# Patient Record
Sex: Male | Born: 2000 | Race: White | Hispanic: No | Marital: Single | Smoking: Former smoker
Health system: Southern US, Community
[De-identification: ages and names within clinical notes are randomized; demographics above are authoritative.]

## PROBLEM LIST (undated history)

## (undated) DIAGNOSIS — F319 Bipolar disorder, unspecified: Secondary | ICD-10-CM

## (undated) DIAGNOSIS — J36 Peritonsillar abscess: Secondary | ICD-10-CM

## (undated) HISTORY — PX: NO PAST SURGERIES: SHX2092

---
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
## 2020-05-08 ENCOUNTER — Ambulatory Visit
Admission: RE | Admit: 2020-05-08 | Discharge: 2020-05-08 | Disposition: A | Discharge status: Home / Self Care | Payer: BC Managed Care – PPO | Source: Ambulatory Visit | Attending: Family Medicine | Admitting: Family Medicine

## 2020-05-08 ENCOUNTER — Other Ambulatory Visit: Payer: Self-pay

## 2020-05-08 VITALS — BP 111/78 | HR 96 | Temp 98.2°F | Resp 18 | Ht 65.0 in | Wt 180.0 lb

## 2020-05-08 DIAGNOSIS — F319 Bipolar disorder, unspecified: Secondary | ICD-10-CM

## 2020-05-08 HISTORY — DX: Bipolar disorder, unspecified: F31.9

## 2020-05-08 MED ORDER — PALIPERIDONE ER 6 MG PO TB24: 6.0000 mg | ORAL_TABLET | Freq: Two times a day (BID) | ORAL | 0 refills | Status: DC

## 2020-05-08 NOTE — Discharge Instructions (Signed)
Be sure to get follow up with a psychiatrist.  Take care  Dr. Adriana Simas

## 2020-05-08 NOTE — ED Triage Notes (Signed)
Patient in today requesting refill on generic Invega 6 mg.Patient ran out today. Patient had both doses yesterday.

## 2020-05-08 NOTE — ED Provider Notes (Signed)
[facility]    CSN: [id number] Arrival date & time: 05/08/20  1448      History   Chief Complaint Chief Complaint  Patient presents with   Appointment   Medication Refill   HPI  19 year old male presents for medication refill.  Patient is bipolar disorder.  Was hospitalized earlier in the year.  He is currently on [place].  He has not seen a psychiatrist in a few months.  He has run out of his medication and is in need of refill.  He states that he is doing well at this time.  He has no concerns.  Past Medical History:  Diagnosis Date   Bipolar disorder [facility])    Past Surgical History:  Procedure Laterality Date   NO PAST SURGERIES     Home Medications    Prior to Admission medications   Medication Sig Start Date End Date Taking? Authorizing Provider  gabapentin (NEURONTIN) 300 MG capsule Take 300 mg by mouth at bedtime. 04/05/20  Yes [provider]  paliperidone (INVEGA) 6 MG 24 hr tablet Take 1 tablet (6 mg total) by mouth 2 (two) times daily. 05/08/20   Tommie Sams, DO    Family History Family History  Problem Relation Age of Onset   Healthy Mother    Other Father        MVA    Social History Social History   Tobacco Use   Smoking status: Current Every Day Smoker   Smokeless tobacco: Never Used   Tobacco comment: 3 cig/day  Vaping Use   Vaping Use: Never used  Substance Use Topics   Alcohol use: No   Drug use: No     Allergies   Penicillin g   Review of Systems Review of Systems  Psychiatric/Behavioral: Negative.    Physical Exam Triage Vital Signs ED Triage Vitals [05/08/20 0903]  Enc Vitals Group     BP 111/78     Pulse Rate 96     Resp 18     Temp 98.2 F (36.8 C)     Temp Source Oral     SpO2 100 %     Weight 180 lb (81.6 kg)     Height 5\' 5"  (1.651 m)     Head Circumference      Peak Flow      Pain Score 0     Pain Loc      Pain Edu?      Excl. in GC?    Updated Vital Signs BP 111/78  (BP Location: Left Arm)    Pulse 96    Temp 98.2 F (36.8 C) (Oral)    Resp 18    Ht 5\' 5"  (1.651 m)    Wt 81.6 kg    SpO2 100%    BMI 29.95 kg/m   Visual Acuity Right Eye Distance:   Left Eye Distance:   Bilateral Distance:    Right Eye Near:   Left Eye Near:    Bilateral Near:     Physical Exam Vitals and nursing note reviewed.  Constitutional:      General: He is not in acute distress.    Appearance: Normal appearance. He is not ill-appearing.  HENT:     Head: Normocephalic and atraumatic.  Cardiovascular:     Rate and Rhythm: Normal rate and regular rhythm.     Heart sounds: No murmur heard.   Pulmonary:     Effort: Pulmonary effort is normal.  Breath sounds: Normal breath sounds. No wheezing, rhonchi or rales.  Neurological:     Mental Status: He is alert.  Psychiatric:     Comments: Flat affect.  Depressed mood.    UC Treatments / Results  Labs (all labs ordered are listed, but only abnormal results are displayed) Labs Reviewed - No data to display  EKG   Radiology No results found.  Procedures Procedures (including critical care time)  Medications Ordered in UC Medications - No data to display  Initial Impression / Assessment and Plan / UC Course  I have reviewed the triage vital signs and the nursing notes.  Pertinent labs & imaging results that were available during my care of the patient were reviewed by me and considered in my medical decision making (see chart for details).    19 year old male presents for medication refill.  Bipolar disorder appears to be stable at this time.  Refill paliperidone.  Advised that he needs to follow-up with psychiatry.  Final Clinical Impressions(s) / UC Diagnoses   Final diagnoses:  Bipolar affective disorder, remission status unspecified [facility])     Discharge Instructions     Be sure to get follow up with a psychiatrist.  Take care  Dr. Adriana Simas    ED Prescriptions    Medication Sig Dispense Auth.  Provider   paliperidone (INVEGA) 6 MG 24 hr tablet Take 1 tablet (6 mg total) by mouth 2 (two) times daily. 60 tablet Everlene Other G, DO     PDMP not reviewed this encounter.   Everlene Other [place], [place] 05/08/20 [phone]

## 2020-06-04 ENCOUNTER — Other Ambulatory Visit: Payer: Self-pay | Admitting: Family Medicine

## 2020-09-17 ENCOUNTER — Telehealth: Payer: Self-pay

## 2020-09-17 NOTE — Telephone Encounter (Signed)
Lvm to r/s 5/17 appt 

## 2020-09-24 ENCOUNTER — Ambulatory Visit: Payer: BC Managed Care – PPO | Admitting: Nurse Practitioner

## 2021-04-25 ENCOUNTER — Other Ambulatory Visit: Payer: Self-pay

## 2021-04-25 ENCOUNTER — Encounter: Payer: Self-pay | Admitting: Emergency Medicine

## 2021-04-25 ENCOUNTER — Ambulatory Visit
Admission: EM | Admit: 2021-04-25 | Discharge: 2021-04-25 | Disposition: A | Discharge status: Home / Self Care | Payer: BC Managed Care – PPO | Attending: Emergency Medicine | Admitting: Emergency Medicine

## 2021-04-25 DIAGNOSIS — J069 Acute upper respiratory infection, unspecified: Secondary | ICD-10-CM | POA: Insufficient documentation

## 2021-04-25 DIAGNOSIS — F17210 Nicotine dependence, cigarettes, uncomplicated: Secondary | ICD-10-CM | POA: Diagnosis not present

## 2021-04-25 DIAGNOSIS — Z20822 Contact with and (suspected) exposure to covid-19: Secondary | ICD-10-CM | POA: Diagnosis not present

## 2021-04-25 DIAGNOSIS — R6883 Chills (without fever): Secondary | ICD-10-CM | POA: Insufficient documentation

## 2021-04-25 LAB — RESP PANEL BY RT-PCR (FLU A&B, COVID) ARPGX2
Influenza A by PCR: NEGATIVE
Influenza B by PCR: NEGATIVE
SARS Coronavirus 2 by RT PCR: NEGATIVE

## 2021-04-25 MED ORDER — IPRATROPIUM BROMIDE 0.06 % NA SOLN: 2.0000 | Freq: Four times a day (QID) | NASAL | 12 refills | Status: DC

## 2021-04-25 NOTE — ED Triage Notes (Signed)
Patient c/o runny nose for a week.  Patient states that he woke up this morning with chills and headache.  Patient unsure of fevers.

## 2021-04-25 NOTE — Discharge Instructions (Signed)
Use the Atrovent nasal spray, 2 squirts in each nostril every 6 hours, as needed for runny nose and postnasal drip.  Use OTC Tylenol and Ibuprofen as needed for fever, aches, or chills.  Return for reevaluation or see your primary care provider for any new or worsening symptoms.

## 2021-04-25 NOTE — ED Provider Notes (Signed)
[facility]    CSN: [id number] Arrival date & time: 04/25/21  0846      History   Chief Complaint Chief Complaint  Patient presents with   Headache   Nasal Congestion    HPI Jonathan Cooke is a 20 y.o. male.   HPI  20 year old male here for evaluation of respiratory complaints.  Patient reports that he has been experiencing a runny nose for the past week that has a clear nasal discharge and then developed headache and chills this morning.  He has not had a fever and denies ear pain, sore throat, cough, GI complaints, or known sick contacts.  Past Medical History:  Diagnosis Date   Bipolar disorder (HCC)     There are no problems to display for this patient.   Past Surgical History:  Procedure Laterality Date   NO PAST SURGERIES         Home Medications    Prior to Admission medications   Medication Sig Start Date End Date Taking? Authorizing Provider  ipratropium (ATROVENT) 0.06 % nasal spray Place 2 sprays into both nostrils 4 (four) times daily. 04/25/21  Yes Becky Augusta, NP  Melatonin 10 MG CAPS Take by mouth.   Yes [provider]    Family History Family History  Problem Relation Age of Onset   Healthy Mother    Other Father        MVA    Social History Social History   Tobacco Use   Smoking status: Every Day    Types: Cigarettes   Smokeless tobacco: Never   Tobacco comments:    3 cig/day  Vaping Use   Vaping Use: Never used  Substance Use Topics   Alcohol use: No   Drug use: No     Allergies   Penicillin g   Review of Systems Review of Systems  Constitutional:  Positive for chills. Negative for activity change, appetite change and fever.  HENT:  Positive for congestion and rhinorrhea. Negative for ear pain and sore throat.   Respiratory:  Negative for cough, shortness of breath and wheezing.   Gastrointestinal:  Negative for diarrhea, nausea and vomiting.  Musculoskeletal:  Negative for arthralgias and  myalgias.  Skin:  Negative for rash.  Neurological:  Positive for headaches.  Hematological: Negative.   Psychiatric/Behavioral: Negative.      Physical Exam Triage Vital Signs ED Triage Vitals  Enc Vitals Group     BP 04/25/21 0953 121/68     Pulse Rate 04/25/21 0953 95     Resp 04/25/21 0953 15     Temp 04/25/21 0953 98.1 F (36.7 C)     Temp Source 04/25/21 0953 Oral     SpO2 04/25/21 0953 99 %     Weight 04/25/21 0949 188 lb (85.3 kg)     Height 04/25/21 0949 5' 5.5" (1.664 m)     Head Circumference --      Peak Flow --      Pain Score 04/25/21 0949 5     Pain Loc --      Pain Edu? --      Excl. in GC? --    No data found.  Updated Vital Signs BP 121/68 (BP Location: Left Arm)    Pulse 95    Temp 98.1 F (36.7 C) (Oral)    Resp 15    Ht 5' 5.5" (1.664 m)    Wt 188 lb (85.3 kg)    SpO2 99%  BMI 30.81 kg/m   Visual Acuity Right Eye Distance:   Left Eye Distance:   Bilateral Distance:    Right Eye Near:   Left Eye Near:    Bilateral Near:     Physical Exam Vitals and nursing note reviewed.  Constitutional:      General: He is not in acute distress.    Appearance: Normal appearance. He is not ill-appearing.  HENT:     Head: Normocephalic and atraumatic.     Right Ear: Ear canal and external ear normal. There is no impacted cerumen.     Left Ear: Ear canal and external ear normal. There is no impacted cerumen.     Nose: Congestion and rhinorrhea present.     Mouth/Throat:     Mouth: Mucous membranes are moist.     Pharynx: Oropharynx is clear. No posterior oropharyngeal erythema.  Cardiovascular:     Rate and Rhythm: Normal rate and regular rhythm.     Pulses: Normal pulses.     Heart sounds: Normal heart sounds. No murmur heard.   No gallop.  Pulmonary:     Effort: Pulmonary effort is normal.     Breath sounds: Normal breath sounds. No wheezing, rhonchi or rales.  Musculoskeletal:     Cervical back: Normal range of motion and neck supple.   Lymphadenopathy:     Cervical: No cervical adenopathy.  Skin:    General: Skin is warm and dry.     Capillary Refill: Capillary refill takes less than 2 seconds.     Findings: No erythema or rash.  Neurological:     General: No focal deficit present.     Mental Status: He is alert and oriented to person, place, and time.  Psychiatric:        Mood and Affect: Mood normal.        Behavior: Behavior normal.        Thought Content: Thought content normal.     UC Treatments / Results  Labs (all labs ordered are listed, but only abnormal results are displayed) Labs Reviewed  RESP PANEL BY RT-PCR (FLU A&B, COVID) [id number]    EKG   Radiology No results found.  Procedures Procedures (including critical care time)  Medications Ordered in UC Medications - No data to display  Initial Impression / Assessment and Plan / UC Course  I have reviewed the triage vital signs and the nursing notes.  Pertinent labs & imaging results that were available during my care of the patient were reviewed by me and considered in my medical decision making (see chart for details).  Patient is a nontoxic-appearing 20 year old male here for evaluation of respiratory complaints as outlined HPI above.  On physical exam patient has mildly erythematous tympanic membranes bilaterally but there is no injection, effusion, or bulging present.  Both external auditory canals are clear.  Nasal mucosa is erythematous and edematous with clear nasal discharge in both nares.  Oropharyngeal exam is benign.  Cardiopulmonary exam reveals clear lung sounds in all fields.  No cervical lymphadenopathy appreciated on exam.  Respiratory triplex panel was collected at triage and is negative for COVID and influenza.  Patient's exam is consistent with a viral URI.  We will give Atrovent nasal spray to help with the nasal congestion and I have given him a work note for today.   Final Clinical Impressions(s) / UC Diagnoses   Final  diagnoses:  Upper respiratory tract infection, unspecified type     Discharge Instructions  Use the Atrovent nasal spray, 2 squirts in each nostril every 6 hours, as needed for runny nose and postnasal drip.  Use OTC Tylenol and Ibuprofen as needed for fever, aches, or chills.  Return for reevaluation or see your primary care provider for any new or worsening symptoms.      ED Prescriptions     Medication Sig Dispense Auth. Provider   ipratropium (ATROVENT) 0.06 % nasal spray Place 2 sprays into both nostrils 4 (four) times daily. 15 mL Becky Augusta, NP      PDMP not reviewed this encounter.   Becky Augusta, NP 04/25/21 1106

## 2021-07-26 ENCOUNTER — Ambulatory Visit
Admission: EM | Admit: 2021-07-26 | Discharge: 2021-07-26 | Disposition: A | Discharge status: Home / Self Care | Payer: BC Managed Care – PPO | Attending: Internal Medicine | Admitting: Internal Medicine

## 2021-07-26 ENCOUNTER — Other Ambulatory Visit: Payer: Self-pay

## 2021-07-26 DIAGNOSIS — L0501 Pilonidal cyst with abscess: Secondary | ICD-10-CM | POA: Insufficient documentation

## 2021-07-26 MED ORDER — SULFAMETHOXAZOLE-TRIMETHOPRIM 800-160 MG PO TABS: 1.0000 | ORAL_TABLET | Freq: Two times a day (BID) | ORAL | 0 refills | Status: AC

## 2021-07-26 NOTE — ED Provider Notes (Signed)
?[facility] ? ? ? ?CSN: [id number] ?Arrival date & time: 07/26/21  [id number] ? ? ?  ? ?History   ?Chief Complaint ?Chief Complaint  ?Patient presents with  ? Abscess  ? ? ?HPI ?Bayardo Nassif is a 21 y.o. male who presents with swelling of his tail bone and drained on 3/11. Has been dealing with this issue x 3 years. Has never been examined for this before.  ? ? ? ?Past Medical History:  ?Diagnosis Date  ? Bipolar disorder ([place])   ? ? ?There are no problems to display for this patient. ? ? ?Past Surgical History:  ?Procedure Laterality Date  ? NO PAST SURGERIES    ? ? ?Home Medications   ? ?Prior to Admission medications   ?Medication Sig Start Date End Date Taking? Authorizing Provider  ?Melatonin 10 MG CAPS Take by mouth.   Yes [provider]  ?sulfamethoxazole-trimethoprim (BACTRIM DS) 800-160 MG tablet Take 1 tablet by mouth 2 (two) times daily for 7 days. 07/26/21 08/02/21 Yes Rodriguez-Southworth, Sunday Spillers, PA-C  ? ? ?Family History ?Family History  ?Problem Relation Age of Onset  ? Healthy Mother   ? Other Father   ?     MVA  ? ? ?Social History ?Social History  ? ?Tobacco Use  ? Smoking status: Every Day  ?  Types: Cigarettes  ? Smokeless tobacco: Never  ? Tobacco comments:  ?  3 cig/day  ?Vaping Use  ? Vaping Use: Every day  ?Substance Use Topics  ? Alcohol use: Yes  ? Drug use: No  ? ? ? ?Allergies   ?Penicillin g ? ? ?Review of Systems ?Review of Systems  ?Constitutional:  Negative for chills and fever.  ?Gastrointestinal:  Negative for rectal pain.  ?Skin:  Positive for wound.  ?Hematological:  Negative for adenopathy.  ? ? ?Physical Exam ?Triage Vital Signs ?ED Triage Vitals  ?Enc Vitals Group  ?   BP 07/26/21 0818 (!) 126/95  ?   Pulse Rate 07/26/21 0818 89  ?   Resp 07/26/21 0818 18  ?   Temp 07/26/21 0818 98.1 ?F (36.7 ?C)  ?   Temp Source 07/26/21 0818 Oral  ?   SpO2 07/26/21 0818 100 %  ?   Weight 07/26/21 0819 185 lb (83.9 kg)  ?   Height 07/26/21 0819 5\' 5"  (1.651 m)  ?   Head  Circumference --   ?   Peak Flow --   ?   Pain Score 07/26/21 0817 9  ?   Pain Loc --   ?   Pain Edu? --   ?   Excl. in [place]? --   ? ?No data found. ? ?Updated Vital Signs ?BP (!) 126/95 (BP Location: Left Arm)   Pulse 89   Temp 98.1 ?F (36.7 ?C) (Oral)   Resp 18   Ht 5\' 5"  (1.651 m)   Wt 185 lb (83.9 kg)   SpO2 100%   BMI 30.79 kg/m?  ? ?Visual Acuity ?Right Eye Distance:   ?Left Eye Distance:   ?Bilateral Distance:   ? ?Right Eye Near:   ?Left Eye Near:    ?Bilateral Near:    ? ?Physical Exam ?Vitals and nursing note reviewed.  ?Constitutional:   ?   General: He is not in acute distress. ?   Appearance: He is not toxic-appearing.  ?HENT:  ?   Right Ear: External ear normal.  ?   Left Ear: External ear normal.  ?Eyes:  ?  General: No scleral icterus. ?   Conjunctiva/sclera: Conjunctivae normal.  ?Pulmonary:  ?   Effort: Pulmonary effort is normal.  ?Musculoskeletal:  ?   Cervical back: Neck supple.  ?Skin: ?   General: Skin is warm and dry.  ?   Findings: No rash.  ?   Comments: GLUTEAL FOLD- upper portion shows fluctuant mass, induration and erythema surround it.   ?Neurological:  ?   Mental Status: He is alert and oriented to person, place, and time.  ?   Gait: Gait normal.  ?Psychiatric:     ?   Mood and Affect: Mood normal.     ?   Behavior: Behavior normal.     ?   Thought Content: Thought content normal.     ?   Judgment: Judgment normal.  ? ? ? ?UC Treatments / Results  ?Labs ?(all labs ordered are listed, but only abnormal results are displayed) ?Labs Reviewed  ?AEROBIC CULTURE W GRAM STAIN (SUPERFICIAL SPECIMEN)  ? ? ?EKG ? ? ?Radiology ?No results found. ? ?Procedures ?Incision and Drainage ? ?Date/Time: 07/26/2021 4:11 PM ?Performed by: Shelby Mattocks, PA-C ?Authorized by: Shelby Mattocks, PA-C  ? ?Consent:  ?  Consent obtained:  Verbal ?  Consent given by:  Patient ?  Risks discussed:  Bleeding, incomplete drainage, pain and damage to other organs ?  Alternatives  discussed:  No treatment ?Universal protocol:  ?  Procedure explained and questions answered to patient or proxy's satisfaction: yes   ?  Patient identity confirmed:  Verbally with patient ?Location:  ?  Type:  Pilonidal cyst ?  Size:  3 cm x 2 cm ?  Location: upper gluteal fold. ?Pre-procedure details:  ?  Skin preparation:  Betadine ?Sedation:  ?  Sedation type:  None ?Anesthesia:  ?  Anesthesia method:  Local infiltration ?  Local anesthetic:  Lidocaine 1% WITH epi ?Procedure type:  ?  Complexity:  Complex ?Procedure details:  ?  Incision types:  Single straight ?  Incision depth:  Subcutaneous ?  Wound management:  Probed and deloculated and irrigated with saline ?  Drainage:  Purulent ?  Drainage amount:  Moderate ?  Packing materials:  1/4 in gauze ?Post-procedure details:  ?  Procedure completion:  Tolerated well, no immediate complications ?Comments:  ?   Dressing applied  (including critical care time) ? ?Medications Ordered in UC ?Medications - No data to display ? ?Initial Impression / Assessment and Plan / UC Course  ?I have reviewed the triage vital signs and the nursing notes. ?Wound culture was sent out ?Has Pilonidal cyst abscess  ?I placed him on Bactrim DS as noted. Wound instructions reviewed and can come here if not comfortable pulling the pack out.  ?We will call pt if the culture shows we need to change the medication. ?Advised to Fu with general surgeon.  ?Final Clinical Impressions(s) / UC Diagnoses  ? ?Final diagnoses:  ?Pilonidal cyst with abscess  ? ? ? ?Discharge Instructions   ? ?  ?Change the outside dressing 1-2 times a day when soaked ?The packing can be pulled out in 3 days, you may do it or come back here  for [place] to help you with it. The main thin, is to remove it when there is hardly any puss on the dressing.  ?You may take showers, but not bath's.  ?I am sending a would culture and this will tell us what bacteria is present and if the antibiotic I am placing you on  is the correct  one, if not we will call you to change it.  ?Follow up with a general surgeon in the next few week.  ? ? ? ?ED Prescriptions   ? ? Medication Sig Dispense Auth. Provider  ? sulfamethoxazole-trimethoprim (BACTRIM DS) 800-160 MG tablet Take 1 tablet by mouth 2 (two) times daily for 7 days. 14 tablet Rodriguez-Southworth, Sunday Spillers, PA-C  ? ?  ? ?PDMP not reviewed this encounter. ?  ?Shelby Mattocks, PA-C ?07/26/21 1616 ? ?

## 2021-07-26 NOTE — ED Triage Notes (Addendum)
Pt c/o abscess on tailbone that has started to hurt. Pt states that it has been present for a long time but recently became painful and has gotten larger. ? ?Pt states that it has drained enough fluid to soak through his underwear and pants on 07/19/21 ? ?

## 2021-07-26 NOTE — Discharge Instructions (Signed)
Change the outside dressing 1-2 times a day when soaked ?The packing can be pulled out in 3 days, you may do it or come back here  for [place] to help you with it. The main thin, is to remove it when there is hardly any puss on the dressing.  ?You may take showers, but not bath's.  ?I am sending a would culture and this will tell us what bacteria is present and if the antibiotic I am placing you on is the correct one, if not we will call you to change it.  ?Follow up with a general surgeon in the next few week.  ?

## 2021-07-28 LAB — AEROBIC CULTURE W GRAM STAIN (SUPERFICIAL SPECIMEN): Special Requests: NORMAL

## 2021-08-11 ENCOUNTER — Ambulatory Visit (INDEPENDENT_AMBULATORY_CARE_PROVIDER_SITE_OTHER): Payer: BC Managed Care – PPO

## 2021-08-11 ENCOUNTER — Ambulatory Visit
Admission: RE | Admit: 2021-08-11 | Discharge: 2021-08-11 | Disposition: A | Discharge status: Home / Self Care | Payer: BC Managed Care – PPO | Source: Ambulatory Visit

## 2021-08-11 VITALS — BP 144/77 | HR 79 | Temp 97.8°F | Resp 16

## 2021-08-11 DIAGNOSIS — M25511 Pain in right shoulder: Secondary | ICD-10-CM | POA: Diagnosis not present

## 2021-08-11 DIAGNOSIS — S43401A Unspecified sprain of right shoulder joint, initial encounter: Secondary | ICD-10-CM | POA: Diagnosis not present

## 2021-08-11 NOTE — Discharge Instructions (Addendum)
-  Your xray looks normal. There is no fracture or AC separation.  ?-Rest, ice, tylenol/ibuprofen ?-Follow-up if symptoms persist  ? ?
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
## 2021-08-11 NOTE — ED Triage Notes (Signed)
Pt reports pain in right shoulder x 2 days after he hear a pop while at a grappling practice. Pain is worse when trying to reach across his body with right arm or try to raise right arm. Ibuprofen gives no relief.  ?

## 2021-08-13 ENCOUNTER — Encounter: Payer: Self-pay | Admitting: Emergency Medicine

## 2021-08-13 ENCOUNTER — Ambulatory Visit
Admission: EM | Admit: 2021-08-13 | Discharge: 2021-08-13 | Disposition: A | Discharge status: Home / Self Care | Payer: BC Managed Care – PPO | Attending: Emergency Medicine | Admitting: Emergency Medicine

## 2021-08-13 ENCOUNTER — Other Ambulatory Visit: Payer: Self-pay

## 2021-08-13 DIAGNOSIS — M25511 Pain in right shoulder: Secondary | ICD-10-CM | POA: Diagnosis not present

## 2021-08-13 MED ORDER — KETOROLAC TROMETHAMINE 60 MG/2ML IM SOLN
30.0000 mg | Freq: Once | INTRAMUSCULAR | Status: AC
  Administered 2021-08-13: 30 mg via INTRAMUSCULAR

## 2021-08-13 MED ORDER — CYCLOBENZAPRINE HCL 10 MG PO TABS
10.0000 mg | ORAL_TABLET | Freq: Every day | ORAL | 0 refills | Status: DC
Start: 2021-08-13 — End: 2021-11-03

## 2021-08-13 MED ORDER — PREDNISONE 20 MG PO TABS: 40.0000 mg | ORAL_TABLET | Freq: Every day | ORAL | 0 refills | Status: DC

## 2021-08-13 NOTE — ED Triage Notes (Signed)
Pt c/o right shoulder pain. He was seen on 08/11/21 for this and told it was a sprain. He states he reached over to get something and felt a "roll and a pop" in his shoulder and now the pain is much worse.  ?

## 2021-08-13 NOTE — Discharge Instructions (Signed)
Today why do not feel like there is injury to your bone, I do have concerns about injury to the rotator cuff therefore you will need to follow-up with an orthopedic specialist for further evaluation and management ? ?You have been given an injection of Toradol in office to help reduce inflammation and for pain ? ?Starting tomorrow take prednisone every morning with food for further reduction of inflammation, while using medication you may use Tylenol throughout the day ? ?You may use muscle relaxer at bedtime as needed for additional comfort ? ?May place heat or ice over the affected area in 10 to 15-minute intervals ? ?You may place pillow underneath your arms and behind your back for support ? ?

## 2021-08-13 NOTE — ED Provider Notes (Signed)
?[facility] ? ? ? ?CSN: [id number] ?Arrival date & time: 08/13/21  1929 ? ? ?  ? ?History   ?Chief Complaint ?Chief Complaint  ?Patient presents with  ? Shoulder Pain  ?  right  ? ? ?HPI ?Jonathan Cooke is a 21 y.o. male.  ? ?Patient presents with right-sided shoulder pain for 5 days, symptoms worsened 1 day ago, rolled over and heard a popping sensation.  Pain is located anteriorly and is described as pulsating.  Limited range of motion, unable to reach her arm across body.  Symptoms worsen when lowering the arm .  Denies numbness or tingling.  Has attempted use of ibuprofen which has been ineffective.  Shoulder pain was evaluated 2 days ago in urgent care setting, x-ray negative.  History of AC joint injury in the left arm.  ? ?Past Medical History:  ?Diagnosis Date  ? Bipolar disorder ([place])   ? ? ?There are no problems to display for this patient. ? ? ?Past Surgical History:  ?Procedure Laterality Date  ? NO PAST SURGERIES    ? ? ? ? ? ?Home Medications   ? ?Prior to Admission medications   ?Medication Sig Start Date End Date Taking? Authorizing Provider  ?ibuprofen (ADVIL) 200 MG tablet Take 200 mg by mouth every 6 (six) hours as needed.    [provider]  ?Melatonin 10 MG CAPS Take by mouth.    [provider]  ? ? ?Family History ?Family History  ?Problem Relation Age of Onset  ? Healthy Mother   ? Other Father   ?     MVA  ? ? ?Social History ?Social History  ? ?Tobacco Use  ? Smoking status: Every Day  ?  Types: Cigarettes  ? Smokeless tobacco: Never  ? Tobacco comments:  ?  3 cig/day  ?Vaping Use  ? Vaping Use: Every day  ?Substance Use Topics  ? Alcohol use: Yes  ? Drug use: No  ? ? ? ?Allergies   ?Penicillin g ? ? ?Review of Systems ?Review of Systems  ?Constitutional: Negative.   ?Respiratory: Negative.    ?Cardiovascular: Negative.   ?Neurological: Negative.   ? ? ?Physical Exam ?Triage Vital Signs ?ED Triage Vitals [08/13/21 1941]  ?Enc Vitals Group  ?   BP 122/84  ?   Pulse  Rate 89  ?   Resp 18  ?   Temp 98.4 ?F (36.9 ?C)  ?   Temp Source Oral  ?   SpO2 100 %  ?   Weight 184 lb 15.5 oz (83.9 kg)  ?   Height 5\' 5"  (1.651 m)  ?   Head Circumference   ?   Peak Flow   ?   Pain Score 9  ?   Pain Loc   ?   Pain Edu?   ?   Excl. in [place]?   ? ?No data found. ? ?Updated Vital Signs ?BP 122/84 (BP Location: Left Arm)   Pulse 89   Temp 98.4 ?F (36.9 ?C) (Oral)   Resp 18   Ht 5\' 5"  (1.651 m)   Wt 184 lb 15.5 oz (83.9 kg)   SpO2 100%   BMI 30.78 kg/m?  ? ?Visual Acuity ?Right Eye Distance:   ?Left Eye Distance:   ?Bilateral Distance:   ? ?Right Eye Near:   ?Left Eye Near:    ?Bilateral Near:    ? ?Physical Exam ?Constitutional:   ?   Appearance: Normal appearance.  ?HENT:  ?  Head: Normocephalic.  ?Eyes:  ?   Extraocular Movements: Extraocular movements intact.  ?Pulmonary:  ?   Effort: Pulmonary effort is normal.  ?Musculoskeletal:  ?   Comments: Tenderness over the [facility] joint and anterior aspect of the right shoulder, limited range of motion, unable to assess Hawkins sign due to level of pain, strength is 4 out of 5, no elbow or wrist involvement  ?Skin: ?   General: Skin is warm and dry.  ?Neurological:  ?   Mental Status: He is alert and oriented to person, place, and time. Mental status is at baseline.  ?Psychiatric:     ?   Mood and Affect: Mood normal.     ?   Behavior: Behavior normal.  ? ? ? ?UC Treatments / Results  ?Labs ?(all labs ordered are listed, but only abnormal results are displayed) ?Labs Reviewed - No data to display ? ?EKG ? ? ?Radiology ?No results found. ? ?Procedures ?Procedures (including critical care time) ? ?Medications Ordered in UC ?Medications - No data to display ? ?Initial Impression / Assessment and Plan / UC Course  ?I have reviewed the triage vital signs and the nursing notes. ? ?Pertinent labs & imaging results that were available during my care of the patient were reviewed by me and considered in my medical decision making (see chart for details). ? ?Acute  pain of the right shoulder ? ?Will defer imaging as x-ray completed 2 days ago, suspicion for injury to the rotator cuff or AC joint, discussed with patient, will need follow-up with orthopedic specialist, given walking referral, Toradol injection given in office today, prednisone 40 mg burst and Flexeril prescribed for outpatient use, recommended RICE, heat, pillows for support and activity as tolerated, work note given ?Final Clinical Impressions(s) / UC Diagnoses  ? ?Final diagnoses:  ?None  ? ?Discharge Instructions   ?None ?  ? ?ED Prescriptions   ?None ?  ? ?PDMP not reviewed this encounter. ?  ?Hans Eden, NP ?08/13/21 2005 ? ?

## 2021-11-03 ENCOUNTER — Ambulatory Visit
Admission: EM | Admit: 2021-11-03 | Discharge: 2021-11-03 | Disposition: A | Discharge status: Home / Self Care | Payer: BC Managed Care – PPO | Attending: Internal Medicine | Admitting: Internal Medicine

## 2021-11-03 DIAGNOSIS — N179 Acute kidney failure, unspecified: Secondary | ICD-10-CM | POA: Diagnosis present

## 2021-11-03 LAB — CBC WITH DIFFERENTIAL/PLATELET
Abs Immature Granulocytes: 0.03 10*3/uL (ref 0.00–0.07)
Basophils Absolute: 0 10*3/uL (ref 0.0–0.1)
Basophils Relative: 0 %
Eosinophils Absolute: 0.1 10*3/uL (ref 0.0–0.5)
Eosinophils Relative: 1 %
HCT: 42.7 % (ref 39.0–52.0)
Hemoglobin: 14.5 g/dL (ref 13.0–17.0)
Immature Granulocytes: 0 %
Lymphocytes Relative: 34 %
Lymphs Abs: 2.5 10*3/uL (ref 0.7–4.0)
MCH: 31.5 pg (ref 26.0–34.0)
MCHC: 34 g/dL (ref 30.0–36.0)
MCV: 92.6 fL (ref 80.0–100.0)
Monocytes Absolute: 0.7 10*3/uL (ref 0.1–1.0)
Monocytes Relative: 10 %
Neutro Abs: 4 10*3/uL (ref 1.7–7.7)
Neutrophils Relative %: 55 %
Platelets: 277 10*3/uL (ref 150–400)
RBC: 4.61 MIL/uL (ref 4.22–5.81)
RDW: 13 % (ref 11.5–15.5)
WBC: 7.3 10*3/uL (ref 4.0–10.5)
nRBC: 0 % (ref 0.0–0.2)

## 2021-11-03 LAB — COMPREHENSIVE METABOLIC PANEL
ALT: 31 U/L (ref 0–44)
AST: 36 U/L (ref 15–41)
Albumin: 4.5 g/dL (ref 3.5–5.0)
Alkaline Phosphatase: 100 U/L (ref 38–126)
Anion gap: 6 (ref 5–15)
BUN: 17 mg/dL (ref 6–20)
CO2: 25 mmol/L (ref 22–32)
Calcium: 9.2 mg/dL (ref 8.9–10.3)
Chloride: 105 mmol/L (ref 98–111)
Creatinine, Ser: 1.3 mg/dL — ABNORMAL HIGH (ref 0.61–1.24)
GFR, Estimated: >60 mL/min (ref >60)
Glucose, Bld: 96 mg/dL (ref 70–99)
Potassium: 3.9 mmol/L (ref 3.5–5.1)
Sodium: 136 mmol/L (ref 135–145)
Total Bilirubin: 0.9 mg/dL (ref 0.3–1.2)
Total Protein: 7.1 g/dL (ref 6.5–8.1)

## 2021-11-03 LAB — CK: Total CK: 309 U/L (ref 49–397)

## 2021-11-03 MED ORDER — SODIUM CHLORIDE 0.9 % IV BOLUS
1000.0000 mL | Freq: Once | INTRAVENOUS | Status: AC
  Administered 2021-11-03: 1000 mL via INTRAVENOUS

## 2022-04-09 ENCOUNTER — Ambulatory Visit
Admission: EM | Admit: 2022-04-09 | Discharge: 2022-04-09 | Disposition: A | Discharge status: Home / Self Care | Payer: BC Managed Care – PPO | Attending: Physician Assistant | Admitting: Physician Assistant

## 2022-04-09 ENCOUNTER — Encounter: Payer: Self-pay | Admitting: Emergency Medicine

## 2022-04-09 DIAGNOSIS — R067 Sneezing: Secondary | ICD-10-CM | POA: Diagnosis not present

## 2022-04-09 DIAGNOSIS — F3170 Bipolar disorder, currently in remission, most recent episode unspecified: Secondary | ICD-10-CM | POA: Insufficient documentation

## 2022-04-09 DIAGNOSIS — Z1152 Encounter for screening for COVID-19: Secondary | ICD-10-CM | POA: Diagnosis not present

## 2022-04-09 DIAGNOSIS — Z79899 Other long term (current) drug therapy: Secondary | ICD-10-CM | POA: Diagnosis not present

## 2022-04-09 DIAGNOSIS — R0989 Other specified symptoms and signs involving the circulatory and respiratory systems: Secondary | ICD-10-CM | POA: Diagnosis not present

## 2022-04-09 DIAGNOSIS — R52 Pain, unspecified: Secondary | ICD-10-CM | POA: Insufficient documentation

## 2022-04-09 DIAGNOSIS — R509 Fever, unspecified: Secondary | ICD-10-CM | POA: Diagnosis not present

## 2022-04-09 HISTORY — DX: Peritonsillar abscess: J36

## 2022-04-09 LAB — RESP PANEL BY RT-PCR (FLU A&B, COVID) ARPGX2
Influenza A by PCR: NEGATIVE
Influenza B by PCR: NEGATIVE
SARS Coronavirus 2 by RT PCR: NEGATIVE

## 2022-04-09 NOTE — ED Provider Notes (Signed)
[facility]    CSN: [id number] Arrival date & time: 04/09/22  1012      History   Chief Complaint Chief Complaint  Patient presents with   Fever   Generalized Body Aches    HPI Jonathan Cooke is a 21 y.o. male.   Patient is a 21 year old male who presents with chief complaint of fever and generalized bodyaches.  Patient reports 2 weeks of having fevers at night that causes him difficulty sleeping along with bodyaches.  Reports aches for his knees and shoulders.  He also reports some nausea in the morning.  He states he is taken DayQuil and NyQuil but those only helped for couple hours.  Patient denies any sick contacts.  Patient denies any chest pain, shortness of breath or abdominal pain.  He does report some occasional sneezing and runny nose.    Past Medical History:  Diagnosis Date   Bipolar disorder (HCC)    Tonsil, abscess     There are no problems to display for this patient.   Past Surgical History:  Procedure Laterality Date   NO PAST SURGERIES         Home Medications    Prior to Admission medications   Medication Sig Start Date End Date Taking? Authorizing Provider  paliperidone (INVEGA) 3 MG 24 hr tablet Take 3 mg by mouth at bedtime. 04/07/22  Yes [provider]  divalproex (DEPAKOTE ER) 500 MG 24 hr tablet Take 1,750 mg by mouth 3 (three) times daily.    [provider]    Family History Family History  Problem Relation Age of Onset   Healthy Mother    Other Father        MVA    Social History Social History   Tobacco Use   Smoking status: Former    Types: Cigarettes   Smokeless tobacco: Current   Tobacco comments:    3 cig/day  Vaping Use   Vaping Use: Every day  Substance Use Topics   Alcohol use: Yes   Drug use: No     Allergies   Penicillin g   Review of Systems Review of Systems as noted in HPI.  Other systems reviewed and found to be negative   Physical Exam Triage Vital Signs ED  Triage Vitals  Enc Vitals Group     BP      Pulse      Resp      Temp      Temp src      SpO2      Weight      Height      Head Circumference      Peak Flow      Pain Score      Pain Loc      Pain Edu?      Excl. in [place]?    No data found.  Updated Vital Signs BP 135/81 (BP Location: Left Arm)   Pulse (!) 113   Temp 98.7 F (37.1 C) (Oral)   Resp 16   Ht 5\' 5"  (1.651 m)   Wt 169 lb 15.6 oz (77.1 kg)   SpO2 98%   BMI 28.29 kg/m     Physical Exam Constitutional:      Appearance: Normal appearance.  HENT:     Nose: No congestion or rhinorrhea.  Cardiovascular:     Rate and Rhythm: Normal rate and regular rhythm.     Pulses: Normal pulses.  Pulmonary:  Effort: Pulmonary effort is normal.     Breath sounds: No wheezing or rhonchi.  Musculoskeletal:     Cervical back: Normal range of motion.  Neurological:     General: No focal deficit present.     Mental Status: He is alert and oriented to person, place, and time.      UC Treatments / Results  Labs (all labs ordered are listed, but only abnormal results are displayed) Labs Reviewed  RESP PANEL BY RT-PCR (FLU A&B, COVID) [id number]    EKG   Radiology No results found.  Procedures Procedures (including critical care time)  Medications Ordered in UC Medications - No data to display  Initial Impression / Assessment and Plan / UC Course  I have reviewed the triage vital signs and the nursing notes.  Pertinent labs & imaging results that were available during my care of the patient were reviewed by me and considered in my medical decision making (see chart for details).    Patient presenting with complaint of 2 weeks of fever and bodyaches at night that are causing trouble sleeping.  Patient states fever is gone in the morning but does have nausea in the morning.  Patient also reporting some sneezing and runny nose.  Patient reports some short to help with taking DayQuil and NyQuil.  Patient denies any  sick contacts.  Exam benign.  Viral swab done but negative  Will recommend Allegra to help with sneezing or runny nose.  I also recommend I Profen Tylenol for the fever and the body aches.  Follow-up with primary care if no improvement.  -Patient's body aches and temperature fluctuations could also be a side effect of his medication taken for bipolar disorder.-Follow-up with his prescribing provider to discuss Final Clinical Impressions(s) / UC Diagnoses   Final diagnoses:  Fever, unspecified  Body aches  Bipolar affective disorder in remission [facility])     Discharge Instructions      -Viral swab is pending, we will call you if the results are positive.  Results can also be seen in MyChart. -Can use ibuprofen Tylenol for your fever as well as body aches, recommend over-the-counter allergy medication (Zyrtec, Claritin, Allegra) for sneezing and runny nose. -Your symptoms of fever and bodyaches could also be an adverse reaction related to the medication you take for bipolar disorder.  Recommend following up with the prescribing provider to discuss your symptoms.     ED Prescriptions   None    PDMP not reviewed this encounter.   Candis Schatz, PA-C 04/09/22 1134

## 2022-04-09 NOTE — Discharge Instructions (Addendum)
-  Viral swab is pending, we will call you if the results are positive.  Results can also be seen in MyChart. -Can use ibuprofen Tylenol for your fever as well as body aches, recommend over-the-counter allergy medication (Zyrtec, Claritin, Allegra) for sneezing and runny nose. -Your symptoms of fever and bodyaches could also be an adverse reaction related to the medication you take for bipolar disorder.  Recommend following up with the prescribing provider to discuss your symptoms.

## 2022-04-09 NOTE — ED Triage Notes (Addendum)
Pt c/o fever only at night when he goes to bed. Last night it was 102. He states he also has nausea and body aches. Denies cough. Covid test negative. He states he "sweats out" the fever and when he wakes up it is gone. Pt also needs a work note.

## 2022-07-01 ENCOUNTER — Encounter: Payer: Self-pay | Admitting: Emergency Medicine

## 2022-07-01 ENCOUNTER — Ambulatory Visit
Admission: EM | Admit: 2022-07-01 | Discharge: 2022-07-01 | Disposition: A | Discharge status: Home / Self Care | Payer: BC Managed Care – PPO | Attending: Emergency Medicine | Admitting: Emergency Medicine

## 2022-07-01 DIAGNOSIS — J069 Acute upper respiratory infection, unspecified: Secondary | ICD-10-CM

## 2022-07-01 MED ORDER — SULFAMETHOXAZOLE-TRIMETHOPRIM 800-160 MG PO TABS: 1.0000 | ORAL_TABLET | Freq: Two times a day (BID) | ORAL | 0 refills | Status: AC

## 2022-07-01 NOTE — ED Triage Notes (Signed)
Pt states that over the past 4-5 months, once a month he experiences body aches, chills, headache and nausea. He states it will last for about 3-4 days and go away. He denies fever. He states each time is tested for covid and is negative. He states he is having another episode that started last night.

## 2022-07-01 NOTE — Discharge Instructions (Signed)
The BActrim twice daily with food for 17 days for treatment of your URI.  Perform sinus irrigation 2-3 times a day with a NeilMed sinus rinse kit and distilled water.  Do not use tap water.  You can use plain over-the-counter Mucinex every 6 hours to break up the stickiness of the mucus so your body can clear it.  Increase your oral fluid intake to thin out your mucus so that is also able for your body to clear more easily.  Take an over-the-counter probiotic, such as Culturelle-align-activia, 1 hour after each dose of antibiotic to prevent diarrhea.  Use OTC Tylenol and Ibuprofen as needed for fever and pain.  If you develop any new or worsening symptoms return for reevaluation or see your primary care provider.

## 2022-07-01 NOTE — ED Provider Notes (Signed)
[facility]    CSN: [id number] Arrival date & time: 07/01/22  1619      History   Chief Complaint Chief Complaint  Patient presents with   Generalized Body Aches    HPI Jonathan Cooke is a 22 y.o. male.   HPI  22 year old male here for evaluation of bodyaches.  The patient has a significant past medical history for bipolar and tonsillar abscess and he presents for evaluation of bodyaches, headache, chills, nausea that started last night.  He states that over the last 4 to 5 months he has been experiencing these recurrent symptoms.  They will come on approximately every 30 days and last 3 to 4 days.  He states that at night his body aches are pretty severe, especially in his shoulders.  He denies any fever, runny nose, sore throat, shortness breath, or wheezing.  He does endorse nasal congestion and infrequent nonproductive cough.  At his last visit in November 2023 it was thought that his temperature fluctuations may be related to his by polar medication.  He states that he followed up with his prescriber who tested drug levels that were normal but indicated that he did have an elevated white blood cell count indicating some form of infection.  He has not had any further workup.  He denies any family history of autoimmune diseases.  Past Medical History:  Diagnosis Date   Bipolar disorder ([place])    Tonsil, abscess     There are no problems to display for this patient.   Past Surgical History:  Procedure Laterality Date   NO PAST SURGERIES         Home Medications    Prior to Admission medications   Medication Sig Start Date End Date Taking? Authorizing Provider  sulfamethoxazole-trimethoprim (BACTRIM DS) 800-160 MG tablet Take 1 tablet by mouth 2 (two) times daily for 7 days. 07/01/22 07/08/22 Yes Margarette Canada, NP  divalproex (DEPAKOTE ER) 500 MG 24 hr tablet Take 1,500 mg by mouth at bedtime.    [provider]  paliperidone (INVEGA) 3 MG 24 hr tablet  Take 3 mg by mouth at bedtime. 04/07/22   [provider]    Family History Family History  Problem Relation Age of Onset   Healthy Mother    Other Father        MVA    Social History Social History   Tobacco Use   Smoking status: Former    Types: Cigarettes   Smokeless tobacco: Current   Tobacco comments:    3 cig/day  Vaping Use   Vaping Use: Every day  Substance Use Topics   Alcohol use: Yes   Drug use: No     Allergies   Penicillin g   Review of Systems Review of Systems  Constitutional:  Positive for chills.  HENT:  Positive for congestion. Negative for rhinorrhea and sore throat.   Respiratory:  Positive for cough.   Gastrointestinal:  Positive for nausea.  Musculoskeletal:  Positive for arthralgias and myalgias.  Neurological:  Positive for headaches.     Physical Exam Triage Vital Signs ED Triage Vitals  Enc Vitals Group     BP      Pulse      Resp      Temp      Temp src      SpO2      Weight      Height      Head Circumference  Peak Flow      Pain Score      Pain Loc      Pain Edu?      Excl. in [place]?    No data found.  Updated Vital Signs BP 126/87 (BP Location: Right Arm)   Pulse (!) 128   Temp 99.9 F (37.7 C) (Oral)   Resp 18   Ht 5' 5"$  (1.651 m)   Wt 169 lb 15.6 oz (77.1 kg)   SpO2 100%   BMI 28.29 kg/m   Visual Acuity Right Eye Distance:   Left Eye Distance:   Bilateral Distance:    Right Eye Near:   Left Eye Near:    Bilateral Near:     Physical Exam Vitals and nursing note reviewed.  Constitutional:      Appearance: Normal appearance. He is not ill-appearing.  HENT:     Head: Normocephalic and atraumatic.     Right Ear: Tympanic membrane, ear canal and external ear normal. There is no impacted cerumen.     Left Ear: Tympanic membrane, ear canal and external ear normal. There is no impacted cerumen.     Nose: Congestion and rhinorrhea present.     Comments: Patient's nasal mucosa is erythematous  and edematous and there is bloody yellow discharge in both nares.  His maxillary frontal sinuses are nontender to compression.    Mouth/Throat:     Mouth: Mucous membranes are moist.     Pharynx: Oropharynx is clear. No oropharyngeal exudate or posterior oropharyngeal erythema.  Cardiovascular:     Rate and Rhythm: Normal rate and regular rhythm.     Pulses: Normal pulses.     Heart sounds: Normal heart sounds. No murmur heard.    No friction rub. No gallop.  Pulmonary:     Effort: Pulmonary effort is normal.     Breath sounds: Normal breath sounds. No wheezing, rhonchi or rales.  Musculoskeletal:     Cervical back: Normal range of motion and neck supple.  Lymphadenopathy:     Cervical: No cervical adenopathy.  Skin:    General: Skin is warm and dry.     Capillary Refill: Capillary refill takes less than 2 seconds.     Findings: No erythema or rash.  Neurological:     General: No focal deficit present.     Mental Status: He is alert and oriented to person, place, and time.      UC Treatments / Results  Labs (all labs ordered are listed, but only abnormal results are displayed) Labs Reviewed - No data to display  EKG   Radiology No results found.  Procedures Procedures (including critical care time)  Medications Ordered in UC Medications - No data to display  Initial Impression / Assessment and Plan / UC Course  I have reviewed the triage vital signs and the nursing notes.  Pertinent labs & imaging results that were available during my care of the patient were reviewed by me and considered in my medical decision making (see chart for details).   Patient is a nontoxic-appearing 22 year old male here for evaluation of cyclic body aches, headache, chills, nausea that has been present for last 4 to 5 months, occurring approximately 30 days, and lasting 3 to 4 days.  His last visit to this urgent care was 04/09/2022.  On exam he does have inflamed nasal mucosa with bloody  discharge in both nares.  He does not have any tenderness to compression of his frontal or maxillary sinuses.  Oropharyngeal and cardiopulmonary exam is benign.  At his last visit it was thought that possibly his bipolar medications might be explaining some of his temperature fluctuations but per the patient's report when he talked to the prescriber blood work was drawn and he had normal therapeutic levels but did have an elevated white blood cell count.  Those results are not available in epic.  He was not treated for an infection.  He denies any history of autoimmune diseases in his family.  Given the bloody discharge in his nasal passages coupled with his elevated temp of 99.9 and mild tachycardia of 128 I suspect he has an infectious process.  I will treat him with Bactrim DS twice daily for 7 days as he has an allergy to penicillin G.  He is unsure if he is taking any other antibiotics though he does have documentation of taking Bactrim DS for pilonidal cyst.  I have also discussed using sinus irrigation over-the-counter Tylenol and/or ibuprofen as needed for fever and pain.  I have also discussed using sinus irrigation to alleviate the mucus burden.  If his symptoms persist he needs to follow-up with his primary care provider for further workup and possible referral to rheumatology to evaluate for autoimmune sources.   Final Clinical Impressions(s) / UC Diagnoses   Final diagnoses:  Upper respiratory tract infection, unspecified type     Discharge Instructions      The BActrim twice daily with food for 17 days for treatment of your URI.  Perform sinus irrigation 2-3 times a day with a NeilMed sinus rinse kit and distilled water.  Do not use tap water.  You can use plain over-the-counter Mucinex every 6 hours to break up the stickiness of the mucus so your body can clear it.  Increase your oral fluid intake to thin out your mucus so that is also able for your body to clear more  easily.  Take an over-the-counter probiotic, such as Culturelle-align-activia, 1 hour after each dose of antibiotic to prevent diarrhea.  Use OTC Tylenol and Ibuprofen as needed for fever and pain.  If you develop any new or worsening symptoms return for reevaluation or see your primary care provider.      ED Prescriptions     Medication Sig Dispense Auth. Provider   sulfamethoxazole-trimethoprim (BACTRIM DS) 800-160 MG tablet Take 1 tablet by mouth 2 (two) times daily for 7 days. 14 tablet Margarette Canada, NP      PDMP not reviewed this encounter.   Margarette Canada, NP 07/01/22 1710

## 2023-06-11 ENCOUNTER — Encounter: Payer: Self-pay | Admitting: Emergency Medicine

## 2023-06-11 ENCOUNTER — Ambulatory Visit
Admission: EM | Admit: 2023-06-11 | Discharge: 2023-06-11 | Disposition: A | Discharge status: Home / Self Care | Payer: MEDICAID

## 2023-06-11 DIAGNOSIS — M545 Low back pain, unspecified: Secondary | ICD-10-CM

## 2023-06-11 DIAGNOSIS — G8929 Other chronic pain: Secondary | ICD-10-CM

## 2023-06-11 DIAGNOSIS — R1031 Right lower quadrant pain: Secondary | ICD-10-CM

## 2023-06-11 NOTE — ED Provider Notes (Signed)
[facility]    CSN: [id number] Arrival date & time: 06/11/23  1442      History   Chief Complaint Chief Complaint  Patient presents with   Abdominal Pain    HPI Jonathan Cooke is a 23 y.o. male.   23 year old male patient, Jonathan Cooke, presents to urgent care for evaluation of right lower quadrant abdominal pain that radiates to his right groin causing his right testicle to feel like it is get squeezed.  Patient states he currently does not have any low back pain , abdominal or testicular pain.  Patient denies any fever,nausea ,vomiting, loss of bowel and bladder, or saddle numbness.  Patient denies any trauma or injury.  Patient states he has been using naproxen for pain with relief of symptoms.   The history is provided by the patient. No language interpreter was used.  Abdominal Pain Associated symptoms: no dysuria, no fever, no hematuria, no nausea and no vomiting     Past Medical History:  Diagnosis Date   Bipolar disorder ([facility])    Tonsil, abscess     Patient Active Problem List   Diagnosis Date Noted   Abdominal pain, chronic, right lower quadrant 06/11/2023   Chronic right-sided low back pain without sciatica 06/11/2023    Past Surgical History:  Procedure Laterality Date   NO PAST SURGERIES         Home Medications    Prior to Admission medications   Medication Sig Start Date End Date Taking? Authorizing Provider  divalproex (DEPAKOTE ER) 500 MG 24 hr tablet Take 1,500 mg by mouth at bedtime.   Yes [provider]  paliperidone (INVEGA) 3 MG 24 hr tablet Take 3 mg by mouth at bedtime. 04/07/22  Yes [provider]    Family History Family History  Problem Relation Age of Onset   Healthy Mother    Other Father        MVA    Social History Social History   Tobacco Use   Smoking status: Former    Types: Cigarettes   Smokeless tobacco: Current   Tobacco comments:    3 cig/day  Vaping Use   Vaping status: Every  Day  Substance Use Topics   Alcohol use: Yes    Comment: social   Drug use: Yes    Types: Marijuana     Allergies   Penicillin g   Review of Systems Review of Systems  Constitutional:  Negative for fever.  Gastrointestinal:  Positive for abdominal pain. Negative for nausea and vomiting.  Genitourinary:  Negative for dysuria, hematuria, penile discharge, penile pain, penile swelling, scrotal swelling and testicular pain.  Musculoskeletal:  Positive for back pain.  Skin:  Negative for rash.  All other systems reviewed and are negative.    Physical Exam Triage Vital Signs ED Triage Vitals  Encounter Vitals Group     BP 06/11/23 1547 114/78     Systolic BP Percentile --      Diastolic BP Percentile --      Pulse Rate 06/11/23 1547 93     Resp 06/11/23 1547 18     Temp 06/11/23 1547 97.7 F (36.5 C)     Temp Source 06/11/23 1547 Oral     SpO2 06/11/23 1547 95 %     Weight --      Height --      Head Circumference --      Peak Flow --      Pain Score 06/11/23 1551  0     Pain Loc --      Pain Education --      Exclude from Growth Chart --    No data found.  Updated Vital Signs BP 114/78   Pulse 93   Temp 97.7 F (36.5 C) (Oral)   Resp 18   SpO2 95%   Visual Acuity Right Eye Distance:   Left Eye Distance:   Bilateral Distance:    Right Eye Near:   Left Eye Near:    Bilateral Near:     Physical Exam Vitals and nursing note reviewed.  Constitutional:      General: He is not in acute distress.    Appearance: He is well-developed.  HENT:     Head: Normocephalic and atraumatic.  Eyes:     Conjunctiva/sclera: Conjunctivae normal.  Cardiovascular:     Rate and Rhythm: Normal rate and regular rhythm.     Heart sounds: No murmur heard. Pulmonary:     Effort: Pulmonary effort is normal. No respiratory distress.     Breath sounds: Normal breath sounds.  Abdominal:     Palpations: Abdomen is soft.     Tenderness: There is no abdominal tenderness.   Musculoskeletal:        General: No swelling.     Cervical back: Neck supple.  Skin:    General: Skin is warm and dry.     Capillary Refill: Capillary refill takes less than 2 seconds.  Neurological:     General: No focal deficit present.     Mental Status: He is alert and oriented to person, place, and time.     GCS: GCS eye subscore is 4. GCS verbal subscore is 5. GCS motor subscore is 6.     Cranial Nerves: No cranial nerve deficit.     Sensory: No sensory deficit.     Comments: No foot drop  Psychiatric:        Attention and Perception: Attention normal.        Mood and Affect: Mood normal.        Speech: Speech normal.      UC Treatments / Results  Labs (all labs ordered are listed, but only abnormal results are displayed) Labs Reviewed - No data to display  EKG   Radiology No results found.  Procedures Procedures (including critical care time)  Medications Ordered in UC Medications - No data to display  Initial Impression / Assessment and Plan / UC Course  I have reviewed the triage vital signs and the nursing notes.  Pertinent labs & imaging results that were available during my care of the patient were reviewed by me and considered in my medical decision making (see chart for details).    Discussed exam findings and plan of care with patient, strict go to ER precautions given.   Patient verbalized understanding to this provider.  Ddx: Chronic abdominal, low back pain Final Clinical Impressions(s) / UC Diagnoses   Final diagnoses:  Abdominal pain, chronic, right lower quadrant  Chronic right-sided low back pain without sciatica     Discharge Instructions      Please follow up with PCP as this is chronic and most likely back related. If you have loss of function,loss of bowel and bladder,saddle numbness, testicle swelling or pain or worsening issues go to ER for further evaluation.     ED Prescriptions   None    PDMP not reviewed this  encounter.

## 2023-06-11 NOTE — Discharge Instructions (Addendum)
Please follow up with PCP as this is chronic and most likely back related. If you have loss of function,loss of bowel and bladder,saddle numbness, testicle swelling or pain or worsening issues go to ER for further evaluation.

## 2023-06-11 NOTE — ED Triage Notes (Signed)
Patient presents with c/o RLQ abdominal discomfort which he states goes down to his right groin and feels like it's squeezing his testicles x 2 years. Patient also states he has diarrhea but he is unsure how long he has had it. Denies any other symptoms at this time.

## 2023-06-26 ENCOUNTER — Ambulatory Visit
Admission: EM | Admit: 2023-06-26 | Discharge: 2023-06-26 | Disposition: A | Discharge status: Home / Self Care | Payer: MEDICAID | Attending: Emergency Medicine | Admitting: Emergency Medicine

## 2023-06-26 ENCOUNTER — Encounter: Payer: Self-pay | Admitting: Emergency Medicine

## 2023-06-26 DIAGNOSIS — L0291 Cutaneous abscess, unspecified: Secondary | ICD-10-CM | POA: Diagnosis not present

## 2023-06-26 MED ORDER — SULFAMETHOXAZOLE-TRIMETHOPRIM 800-160 MG PO TABS: 1.0000 | ORAL_TABLET | Freq: Two times a day (BID) | ORAL | 0 refills | Status: AC

## 2023-06-26 NOTE — ED Provider Notes (Signed)
 [facility] [place] [facility] - [place], [place]   Name: Jonathan Cooke DOB: April 20, 2001 MRN: [id number] CSN: [id number] PCP: Marina Goodell, MD  Arrival date and time:  06/26/23 1027  Chief Complaint:  Abscess   NOTE: Prior to seeing the patient today, I have reviewed the triage nursing documentation and vital signs. Clinical staff has updated patient's PMH/PSHx, current medication list, and drug allergies/intolerances to ensure comprehensive history available to assist in medical decision making.   History:   HPI: Jonathan Cooke is a 23 y.o. male who presents today alone with complaints of recurrent abscess.  Patient states he first noticed this area of abscess approximately 2 to 3 years ago.  He required a I&D to the area at that time.  Since this initial I&D, he states he has had on and off soreness to the previous abscess.  However 2 weeks ago soreness and pain started to worsen.  He has noticed difficulty sitting and walking due to the pain.  He denies any systemic symptoms.  No recent antibiotics.  He has not been evaluated by dermatologist.   Past Medical History:  Diagnosis Date   Bipolar disorder ([facility])    Tonsil, abscess     Past Surgical History:  Procedure Laterality Date   NO PAST SURGERIES      Family History  Problem Relation Age of Onset   Healthy Mother    Other Father        MVA    Social History   Tobacco Use   Smoking status: Former    Types: Cigarettes   Smokeless tobacco: Current   Tobacco comments:    3 cig/day  Vaping Use   Vaping status: Every Day  Substance Use Topics   Alcohol use: Yes    Comment: social   Drug use: Yes    Types: Marijuana    Patient Active Problem List   Diagnosis Date Noted   Abdominal pain, chronic, right lower quadrant 06/11/2023   Chronic right-sided low back pain without sciatica 06/11/2023    Home Medications:    Current Meds  Medication Sig   sulfamethoxazole-trimethoprim (BACTRIM DS) 800-160 MG tablet Take 1  tablet by mouth 2 (two) times daily for 7 days.    Allergies:   Penicillin g  Review of Systems (ROS): Review of Systems  Skin:  Positive for wound. Negative for color change, pallor and rash.       Abscess  All other systems reviewed and are negative.    Vital Signs: Today's Vitals   06/26/23 1133 06/26/23 1134  BP:  (!) 133/90  Pulse:  (!) 104  Resp:  15  Temp:  98.7 F (37.1 C)  TempSrc:  Oral  SpO2:  98%  Weight: 169 lb 15.6 oz (77.1 kg)   Height: 5\' 5"  (1.651 m)   PainSc: 8      Physical Exam: Physical Exam Vitals and nursing note reviewed. Chaperone present: Chaperone deferred.  Constitutional:      Appearance: Normal appearance.  Cardiovascular:     Rate and Rhythm: Normal rate and regular rhythm.     Pulses: Normal pulses.     Heart sounds: Normal heart sounds.  Pulmonary:     Effort: Pulmonary effort is normal.     Breath sounds: Normal breath sounds.  Skin:    General: Skin is warm and dry.     Comments: Firm and indurated area noted to the left gluteal cheek fold.  Blanchable erythema throughout.  Area does not  appear suitable for I&D due to lack of fluctuance.  Neurological:     General: No focal deficit present.     Mental Status: He is alert and oriented to person, place, and time.  Psychiatric:        Mood and Affect: Mood normal.        Behavior: Behavior normal.      Urgent Care Treatments / Results:   LABS: PLEASE NOTE: all labs that were ordered this encounter are listed, however only abnormal results are displayed. Labs Reviewed - No data to display  EKG: -None  RADIOLOGY: No results found.  PROCEDURES: Procedures  MEDICATIONS RECEIVED THIS VISIT: Medications - No data to display  PERTINENT CLINICAL COURSE NOTES/UPDATES:   Initial Impression / Assessment and Plan / Urgent Care Course:  Pertinent labs & imaging results that were available during my care of the patient were personally reviewed by me and considered in my  medical decision making (see lab/imaging section of note for values and interpretations).  Jonathan Cooke is a 23 y.o. male who presents to [facility] today with complaints of abscess, diagnosed with the same, and treated as such with the medications below. NP and patient reviewed discharge instructions below during visit.   Patient is well appearing overall in clinic today. He does not appear to be in any acute distress. Presenting symptoms (see HPI) and exam as documented above.   I have reviewed the follow up and strict return precautions for any new or worsening symptoms. Patient is aware of symptoms that would be deemed urgent/emergent, and would thus require further evaluation either here or in the emergency department. At the time of discharge, he verbalized understanding and consent with the discharge plan as it was reviewed with him. All questions were fielded by provider and/or clinic staff prior to patient discharge.    Final Clinical Impressions / Urgent Care Diagnoses:   Final diagnoses:  Abscess    New Prescriptions:  [place] Controlled Substance Registry consulted? Not Applicable  Meds ordered this encounter  Medications   sulfamethoxazole-trimethoprim (BACTRIM DS) 800-160 MG tablet    Sig: Take 1 tablet by mouth 2 (two) times daily for 7 days.    Dispense:  14 tablet    Refill:  0      Discharge Instructions      You were seen for abscess and are being treated for the same.   - Take the antibiotics as prescribed until they're finished. If you think you're having a reaction, stop the medication, take benadryl and go to the nearest urgent care/emergency room. Take a probiotic while taking the antibiotic to decrease the chances of stomach upset.  -Use warm compresses to the area to help with the infection. -Treat your pain with Tylenol and ibuprofen. -Follow-up with your primary care provider to get a referral to a dermatologist.  Take care, Dr. Sharlet Salina,  NP-c     Recommended Follow up Care:  Patient encouraged to follow up with the following provider within the specified time frame, or sooner as dictated by the severity of his symptoms. As always, he was instructed that for any urgent/emergent care needs, he should seek care either here or in the emergency department for more immediate evaluation.   Jonathan Mech, DNP, NP-c   Jonathan Mech, NP 06/26/23 1220

## 2023-06-26 NOTE — Discharge Instructions (Signed)
 You were seen for abscess and are being treated for the same.   - Take the antibiotics as prescribed until they're finished. If you think you're having a reaction, stop the medication, take benadryl and go to the nearest urgent care/emergency room. Take a probiotic while taking the antibiotic to decrease the chances of stomach upset.  -Use warm compresses to the area to help with the infection. -Treat your pain with Tylenol and ibuprofen. -Follow-up with your primary care provider to get a referral to a dermatologist.  Take care, Dr. Sharlet Salina, NP-c

## 2023-06-26 NOTE — ED Triage Notes (Signed)
 Patient reports abscess on his tailbone for the past 2 weeks.  Patient denies any drainage.  Patient denies fevers.

## 2023-06-27 ENCOUNTER — Ambulatory Visit
Admission: EM | Admit: 2023-06-27 | Discharge: 2023-06-27 | Disposition: A | Discharge status: Home / Self Care | Payer: MEDICAID | Attending: Emergency Medicine | Admitting: Emergency Medicine

## 2023-06-27 DIAGNOSIS — L0591 Pilonidal cyst without abscess: Secondary | ICD-10-CM | POA: Diagnosis present

## 2023-06-27 MED ORDER — IBUPROFEN 600 MG PO TABS: 600.0000 mg | ORAL_TABLET | Freq: Three times a day (TID) | ORAL | 0 refills | Status: DC | PRN

## 2023-06-27 MED ORDER — CHLORHEXIDINE GLUCONATE 4 % EX SOLN: Freq: Every day | CUTANEOUS | 0 refills | Status: DC | PRN

## 2023-06-27 MED ORDER — METRONIDAZOLE 500 MG PO TABS
500.0000 mg | ORAL_TABLET | Freq: Two times a day (BID) | ORAL | 0 refills | Status: DC
Start: 2023-06-27 — End: 2023-06-30

## 2023-06-27 NOTE — Discharge Instructions (Addendum)
 I am adding Flagyl to the Bactrim.  Make sure you finish both, even if you feel better.  I have sent the material off for culture to make sure we have you on the right antibiotic.  Relief.  Start 1000 mg of Tylenol combined with 100 mg of ibuprofen 3-4 times a day.  Warm compresses, heating pad, sitz bath's with chlorhexidine soap or sea salt can be helpful.  A sitz bath is where you feel up the tub with several inches of warm water and then put some of the chlorhexidine or sea salt in it.  Make sure you rinse off well afterwards.

## 2023-06-27 NOTE — ED Triage Notes (Signed)
 Pt c/o cyst along the tailbone x2years  Pt was seen on 2.15.25 and states that the cyst has began to drain with continued pain and tenderness  Pt states that the cyst began to drain this morning and when he sits back it feels like he is sitting on a rock.

## 2023-06-27 NOTE — ED Provider Notes (Signed)
 HPI  SUBJECTIVE:  Jonathan Cooke is a 23 y.o. male who presents with a dull, achy painful mass over his gluteal cleft for the past 2 years that got worse 2 weeks ago.  He states that he has gotten bigger. He was seen here yesterday, and there was nothing to I&D at that time.  He was sent home with 7 days of Bactrim.  He has been doing warm compresses, taking Bactrim and Aleve, and states that it started draining purulent bloody material this morning.  He states that the pain got better after it started draining.  Symptoms are better with sitting forward and worse with sitting back.  He has no past medical history, specifically of MRSA.  PCP: [facility].  Past Medical History:  Diagnosis Date   Bipolar disorder (HCC)    Tonsil, abscess     Past Surgical History:  Procedure Laterality Date   NO PAST SURGERIES      Family History  Problem Relation Age of Onset   Healthy Mother    Other Father        MVA    Social History   Tobacco Use   Smoking status: Former    Types: Cigarettes   Smokeless tobacco: Current   Tobacco comments:    3 cig/day  Vaping Use   Vaping status: Former  Substance Use Topics   Alcohol use: Yes    Comment: social   Drug use: Yes    Types: Marijuana    No current facility-administered medications for this encounter.  Current Outpatient Medications:    chlorhexidine (HIBICLENS) 4 % external liquid, Apply topically daily as needed., Disp: 120 mL, Rfl: 0   divalproex (DEPAKOTE ER) 500 MG 24 hr tablet, Take 1,500 mg by mouth at bedtime., Disp: , Rfl:    ibuprofen (ADVIL) 600 MG tablet, Take 1 tablet (600 mg total) by mouth every 8 (eight) hours as needed., Disp: 30 tablet, Rfl: 0   metroNIDAZOLE (FLAGYL) 500 MG tablet, Take 1 tablet (500 mg total) by mouth 2 (two) times daily., Disp: 14 tablet, Rfl: 0   paliperidone (INVEGA) 3 MG 24 hr tablet, Take 3 mg by mouth at bedtime., Disp: , Rfl:    sulfamethoxazole-trimethoprim (BACTRIM DS) 800-160 MG  tablet, Take 1 tablet by mouth 2 (two) times daily for 7 days., Disp: 14 tablet, Rfl: 0  Allergies  Allergen Reactions   Penicillin G Rash    Full body rash Full body rash      ROS  As noted in HPI.   Physical Exam  BP (P) 134/88 (BP Location: Left Arm)   Pulse (P) 98   Temp (P) 97.8 F (36.6 C) (Oral)   Ht 5\' 5"  (1.651 m)   Wt 94.3 kg   SpO2 (P) 99%   BMI 34.61 kg/m   Constitutional: Well developed, well nourished, no acute distress Eyes:  EOMI, conjunctiva normal bilaterally HENT: Normocephalic, atraumatic,mucus membranes moist Respiratory: Normal inspiratory effort Cardiovascular: Normal rate GI: nondistended skin:  Large amount of odorous, bloody brown drainage coming from left gluteal cleft   Tender area of induration, erythema left gluteal region extending to the midline.  Positive open sinus that is spontaneously draining.   Musculoskeletal: no deformities Neurologic: Alert & oriented x 3, no focal neuro deficits Psychiatric: Speech and behavior appropriate   ED Course   Medications - No data to display  Orders Placed This Encounter  Procedures   Aerobic Culture w Gram Stain (superficial specimen)  Standing Status:   Standing    Number of Occurrences:   1    No results found for this or any previous visit (from the past 24 hours). No results found.  ED Clinical Impression  1. Infected pilonidal cyst      ED Assessment/Plan     Yesterday's records reviewed.  As noted in HPI.  Patient presents with a acutely infected pilonidal cyst that is spontaneously draining.  I was able to express a copious amount of purulent brown bloody drainage with some improvement in his symptoms.  Sent this off for culture to confirm antibiotic choice.  Will have him continue the Bactrim but also add Flagyl per up-to-date recommendations.  Adding ibuprofen/Tylenol.  Discontinue Aleve as it is not helping.  He states that he is establishing care with dermatology  tomorrow for this, but will provide information for local surgical practices for definitive treatment/excision when this is better.  Will have staff apply bandage.  Discussed MDM, treatment plan, and plan for follow-up with patient. Discussed sn/sx that should prompt return to the ED. patient agrees with plan.   Meds ordered this encounter  Medications   metroNIDAZOLE (FLAGYL) 500 MG tablet    Sig: Take 1 tablet (500 mg total) by mouth 2 (two) times daily.    Dispense:  14 tablet    Refill:  0   ibuprofen (ADVIL) 600 MG tablet    Sig: Take 1 tablet (600 mg total) by mouth every 8 (eight) hours as needed.    Dispense:  30 tablet    Refill:  0   chlorhexidine (HIBICLENS) 4 % external liquid    Sig: Apply topically daily as needed.    Dispense:  120 mL    Refill:  0      *This clinic note was created using Scientist, clinical (histocompatibility and immunogenetics). Therefore, there may be occasional mistakes despite careful proofreading.  ?    Domenick Gong, MD 06/28/23 [phone]

## 2023-06-30 ENCOUNTER — Telehealth: Payer: Self-pay | Admitting: Emergency Medicine

## 2023-06-30 LAB — AEROBIC CULTURE W GRAM STAIN (SUPERFICIAL SPECIMEN)

## 2023-06-30 MED ORDER — CEFDINIR 300 MG PO CAPS
300.0000 mg | ORAL_CAPSULE | Freq: Two times a day (BID) | ORAL | 0 refills | Status: AC
Start: 2023-06-30 — End: 2023-07-05

## 2023-06-30 NOTE — Telephone Encounter (Signed)
 Culture result reviewed.  Abundant group C strep.  Will add on omnicef 300 mg bid for 5 days to the Bactrim.   Bactrim is not first-line for group C strep, but will treat the staph hemolyticus that also grew out in the culture. Pt has "full body rash" to PCN. denies anaphylaxis, this occurred ~>5 years ago. Willing to try [facility] after staff discussed risks/benefits.  E-prescribing omnicef to pharmacy on record.  staff to notify patient of prescription waiting for him the pharmacy.  Advised PCN allergy testing at some point.

## 2024-01-03 ENCOUNTER — Ambulatory Visit: Payer: Self-pay | Admitting: Emergency Medicine

## 2024-01-03 ENCOUNTER — Ambulatory Visit
Admission: EM | Admit: 2024-01-03 | Discharge: 2024-01-03 | Disposition: A | Discharge status: Home / Self Care | Payer: MEDICAID | Attending: Emergency Medicine | Admitting: Emergency Medicine

## 2024-01-03 DIAGNOSIS — J069 Acute upper respiratory infection, unspecified: Secondary | ICD-10-CM | POA: Insufficient documentation

## 2024-01-03 DIAGNOSIS — Z20822 Contact with and (suspected) exposure to covid-19: Secondary | ICD-10-CM | POA: Diagnosis present

## 2024-01-03 LAB — SARS CORONAVIRUS 2 BY RT PCR: SARS Coronavirus 2 by RT PCR: NEGATIVE

## 2024-01-03 MED ORDER — IBUPROFEN 600 MG PO TABS: 600.0000 mg | ORAL_TABLET | Freq: Four times a day (QID) | ORAL | 0 refills | Status: DC | PRN

## 2024-01-03 MED ORDER — FLUTICASONE PROPIONATE 50 MCG/ACT NA SUSP: 2.0000 | Freq: Every day | NASAL | 0 refills | Status: DC

## 2024-01-03 NOTE — ED Provider Notes (Signed)
 HPI  SUBJECTIVE:  Jonathan Cooke is a 23 y.o. male who presents with 4 days of headache, nausea, decreased appetite, body aches, nasal congestion, clear rhinorrhea, productive of clear sputum, diarrhea.  No fevers, sinus pain or pressure, sore throat, postnasal drip, wheezing or shortness of breath, vomiting, abdominal pain.  He is able to sleep at night without waking up coughing.  No known COVID, flu exposure but he works in [employer].  He got 2 doses of the COVID-vaccine and did not get last year's flu vaccine.  No antipyretic in the past 6 hours.  No antibiotics in the past month.  He tried rest with improvement in his symptoms.  No aggravating factors.  He has no past medical history.  PCP: [facility]     Past Medical History:  Diagnosis Date   Bipolar disorder (HCC)    Tonsil, abscess     Past Surgical History:  Procedure Laterality Date   NO PAST SURGERIES      Family History  Problem Relation Age of Onset   Healthy Mother    Other Father        MVA    Social History   Tobacco Use   Smoking status: Former    Types: Cigarettes   Smokeless tobacco: Current   Tobacco comments:    3 cig/day  Vaping Use   Vaping status: Former  Substance Use Topics   Alcohol use: Yes    Comment: rarely   Drug use: Yes    Frequency: 7.0 times per week    Types: Marijuana    No current facility-administered medications for this encounter.  Current Outpatient Medications:    divalproex (DEPAKOTE ER) 500 MG 24 hr tablet, Take 1,500 mg by mouth at bedtime., Disp: , Rfl:    fluticasone  (FLONASE ) 50 MCG/ACT nasal spray, Place 2 sprays into both nostrils daily., Disp: 16 g, Rfl: 0   haloperidol decanoate (HALDOL DECANOATE) 100 MG/ML injection, Inject 100 mg into the muscle every 28 (twenty-eight) days., Disp: , Rfl:    ibuprofen  (ADVIL ) 600 MG tablet, Take 1 tablet (600 mg total) by mouth every 6 (six) hours as needed., Disp: 30 tablet, Rfl: 0  Allergies  Allergen Reactions    Penicillin G Rash    Full body rash Full body rash      ROS  As noted in HPI.   Physical Exam  BP 123/83 (BP Location: Right Arm)   Pulse 83   Temp 98.1 F (36.7 C) (Oral)   Resp 18   SpO2 98%   Constitutional: Well developed, well nourished, no acute distress Eyes: PERRL, EOMI, conjunctiva normal bilaterally HENT: Normocephalic, atraumatic,mucus membranes moist.  Erythematous, swollen turbinates.  Clear nasal congestion.  No maxillary, frontal sinus tenderness.  Normal oropharynx.  Normal tonsils without exudates.  Uvula midline.  No postnasal drip. Neck: Positive cervical lymphadenopathy Respiratory: Clear to auscultation bilaterally, no rales, no wheezing, no rhonchi Cardiovascular: Normal rate and rhythm, no murmurs, no gallops, no rubs GI: nondistended skin: No rash, skin intact Musculoskeletal: no deformities Neurologic: Alert & oriented x 3, CN III-XII grossly intact, no motor deficits, sensation grossly intact Psychiatric: Speech and behavior appropriate   ED Course   Medications - No data to display  Orders Placed This Encounter  Procedures   SARS Coronavirus 2 by RT PCR (hospital order, performed in [facility] hospital lab) *cepheid single result test* Anterior Nasal Swab    Standing Status:   Standing    Number of Occurrences:  1   Results for orders placed or performed during the hospital encounter of 01/03/24 (from the past 24 hours)  SARS Coronavirus 2 by RT PCR (hospital order, performed in [facility] hospital lab) *cepheid single result test* Anterior Nasal Swab     Status: None   Collection Time: 01/03/24  9:04 AM   Specimen: Anterior Nasal Swab  Result Value Ref Range   SARS Coronavirus 2 by RT PCR NEGATIVE NEGATIVE   No results found.  ED Clinical Impression  1. Acute upper respiratory infection   2. Encounter for laboratory testing for COVID-19 virus      ED Assessment/Plan     Patient presents with an upper respiratory infection  and requesting testing for COVID.  Will contact patient [phone] if and only if COVID is positive.  In the meantime, Zofran, saline nasal irrigation Flonase , Mucinex D, Tylenol /ibuprofen .  Patient declined prescription of cough syrup.  Work note for several days.  Discussed that he must mask around others at all times for 10 days after symptom onset if COVID is positive.  Follow-up PCP as needed.  COVID-negative.  Plan as above.  Discussed labs, MDM, treatment plan, and plan for follow-up with patient . patient agrees with plan.   Meds ordered this encounter  Medications   fluticasone  (FLONASE ) 50 MCG/ACT nasal spray    Sig: Place 2 sprays into both nostrils daily.    Dispense:  16 g    Refill:  0   ibuprofen  (ADVIL ) 600 MG tablet    Sig: Take 1 tablet (600 mg total) by mouth every 6 (six) hours as needed.    Dispense:  30 tablet    Refill:  0      *This clinic note was created using Scientist, clinical (histocompatibility and immunogenetics). Therefore, there may be occasional mistakes despite careful proofreading. ?    Van Knee, MD 01/04/24 [phone]

## 2024-01-03 NOTE — ED Triage Notes (Signed)
 Pt reports two days of HA, nausea, malaise, low fever (not above 100).  Has not taken anything for it. Unknown exposure - works in [employer].

## 2024-01-03 NOTE — Discharge Instructions (Signed)
 We will contact you if and only if your COVID is positive.  You will also get the results through MyChart.  Start Mucinex-D to keep the mucous thin and to decongest you.   You may take 600 mg of motrin  with 1000 mg of tylenol  up to 3-4 times a day as needed for pain. This is an effective combination for pain.  Most sinus infections are viral and do not need antibiotics unless you have a high fever, have had this for 10 days, or you get better and then get sick again. Use a NeilMed sinus rinse with distilled water as often as you want to to reduce nasal congestion. Follow the directions on the box.  Flonase  for nasal congestion.  Go to www.goodrx.com to look up your medications. This will give you a list of where you can find your prescriptions at the most affordable prices. Or you can ask the pharmacist what the cash price is. This is frequently cheaper than going through insurance.

## 2024-01-20 ENCOUNTER — Encounter: Payer: Self-pay | Admitting: *Deleted

## 2024-01-20 ENCOUNTER — Ambulatory Visit
Admission: EM | Admit: 2024-01-20 | Discharge: 2024-01-20 | Disposition: A | Discharge status: Home / Self Care | Payer: MEDICAID | Attending: Family Medicine | Admitting: Family Medicine

## 2024-01-20 DIAGNOSIS — J069 Acute upper respiratory infection, unspecified: Secondary | ICD-10-CM | POA: Insufficient documentation

## 2024-01-20 LAB — GROUP A STREP BY PCR: Group A Strep by PCR: NOT DETECTED

## 2024-01-20 MED ORDER — IPRATROPIUM BROMIDE 0.06 % NA SOLN: 2.0000 | Freq: Four times a day (QID) | NASAL | 0 refills | Status: DC

## 2024-01-20 NOTE — Discharge Instructions (Addendum)
 We will contact you if your strep test is positive.  If negative, you have a viral respiratory infection that will gradually improve over the next 7-10 days. Cough may last up to 3 weeks.    You can take Tylenol  and/or Ibuprofen  as needed for fever reduction and pain relief.    For cough: honey 1/2 to 1 teaspoon (you can dilute the honey in water or another fluid).  You can also use guaifenesin and dextromethorphan for cough.  You can use a humidifier for chest congestion and cough.  If you don't have a humidifier, you can sit in the bathroom with the hot shower running.      For sore throat: try warm salt water gargles, Mucinex sore throat cough drops or cepacol lozenges, throat spray, warm tea or water with lemon/honey, popsicles or ice, or OTC cold relief medicine for throat discomfort. You can also purchase chloraseptic spray at the pharmacy or dollar store.    For congestion: take a daily anti-histamine like Zyrtec, Claritin, and a oral decongestant, such as pseudoephedrine. Pick up the prescription nasal spray from the pharmacy.    It is important to stay hydrated: drink plenty of fluids (water, gatorade/powerade/pedialyte, juices, or teas) to keep your throat moisturized and help further relieve irritation/discomfort.    Return or go to the Emergency Department if symptoms worsen or do not improve in the next few days

## 2024-01-20 NOTE — ED Provider Notes (Signed)
 [facility]    CSN: [id number] Arrival date & time: 01/20/24  1558      History   Chief Complaint Chief Complaint  Patient presents with   Nasal Congestion   Headache   Cough    HPI Jonathan Cooke is a 23 y.o. male.   HPI  History obtained from the patient. Jonathan Cooke presents for headache, nasal congestion, body aches, rhinorrhea, cough and sore throat that started 2 days ago. No pain with swallowing.  Home COVID and influenza tests were negative today.  Denies fever, vomiting and diarrhea.  No known sick contacts.  Tried Daytime and Nite-time cold medications without relief.    No history of asthma. He vapes and is a  former cigarette smoker.         Past Medical History:  Diagnosis Date   Bipolar disorder (HCC)    Tonsil, abscess     Patient Active Problem List   Diagnosis Date Noted   Abdominal pain, chronic, right lower quadrant 06/11/2023   Chronic right-sided low back pain without sciatica 06/11/2023    Past Surgical History:  Procedure Laterality Date   NO PAST SURGERIES         Home Medications    Prior to Admission medications   Medication Sig Start Date End Date Taking? Authorizing Provider  ipratropium (ATROVENT ) 0.06 % nasal spray Place 2 sprays into both nostrils 4 (four) times daily. 01/20/24  Yes Dayne Dekay, DO  metFORMIN (GLUCOPHAGE-XR) 500 MG 24 hr tablet Take 500 mg by mouth 2 (two) times daily. 01/11/24  Yes [provider]  divalproex (DEPAKOTE ER) 500 MG 24 hr tablet Take 1,500 mg by mouth at bedtime.    [provider]  haloperidol decanoate (HALDOL DECANOATE) 100 MG/ML injection Inject 100 mg into the muscle every 28 (twenty-eight) days.    [provider]  ibuprofen  (ADVIL ) 600 MG tablet Take 1 tablet (600 mg total) by mouth every 6 (six) hours as needed. 01/03/24   Van Knee, MD    Family History Family History  Problem Relation Age of Onset   Healthy Mother    Other Father         MVA    Social History Social History   Tobacco Use   Smoking status: Former    Types: Cigarettes   Smokeless tobacco: Current   Tobacco comments:    3 cig/day  Vaping Use   Vaping status: Every Day  Substance Use Topics   Alcohol use: Yes    Comment: rarely   Drug use: Yes    Frequency: 7.0 times per week    Types: Marijuana     Allergies   Penicillin g   Review of Systems Review of Systems: negative unless otherwise stated in HPI.      Physical Exam Triage Vital Signs ED Triage Vitals  Encounter Vitals Group     BP 01/20/24 1615 129/85     Girls Systolic BP Percentile --      Girls Diastolic BP Percentile --      Boys Systolic BP Percentile --      Boys Diastolic BP Percentile --      Pulse Rate 01/20/24 1615 98     Resp 01/20/24 1615 20     Temp 01/20/24 1615 98.2 F (36.8 C)     Temp Source 01/20/24 1615 Oral     SpO2 01/20/24 1615 97 %     Weight 01/20/24 1613 203 lb (92.1 kg)  Height 01/20/24 1613 5' 5 (1.651 m)     Head Circumference --      Peak Flow --      Pain Score 01/20/24 1613 6     Pain Loc --      Pain Education --      Exclude from Growth Chart --    No data found.  Updated Vital Signs BP 129/85 (BP Location: Right Arm)   Pulse 98   Temp 98.2 F (36.8 C) (Oral)   Resp 20   Ht 5' 5 (1.651 m)   Wt 92.1 kg   SpO2 97%   BMI 33.78 kg/m   Visual Acuity Right Eye Distance:   Left Eye Distance:   Bilateral Distance:    Right Eye Near:   Left Eye Near:    Bilateral Near:     Physical Exam GEN:     alert, non-toxic appearing male in no distress    HENT:  mucus membranes moist, oropharyngeal without lesions, mild erythema, no tonsillar hypertrophy or exudates,  moderate erythematous edematous turbinates, clear nasal discharge, bilateral TM normal EYES:   pupils equal and reactive, no scleral injection or discharge NECK:  normal ROM, no lymphadenopathy, no meningismus   RESP:  no increased work of breathing, clear to  auscultation bilaterally CVS:   regular rate and rhythm Skin:   warm and dry, no rash on visible skin    UC Treatments / Results  Labs (all labs ordered are listed, but only abnormal results are displayed) Labs Reviewed  GROUP A STREP BY PCR    EKG   Radiology No results found.   Procedures Procedures (including critical care time)  Medications Ordered in UC Medications - No data to display  Initial Impression / Assessment and Plan / UC Course  I have reviewed the triage vital signs and the nursing notes.  Pertinent labs & imaging results that were available during my care of the patient were reviewed by me and considered in my medical decision making (see chart for details).       Pt is a 23 y.o. male who presents for 2 days of respiratory symptoms. Jonathan Cooke is afebrile here without recent antipyretics. Satting well on room air. Overall pt is non-toxic appearing, well hydrated, without respiratory distress. Pulmonary exam is unremarkable.  Home COVID and influenza panel was negative. Strep PCR is negative.  Suspect acute viral respiratory illness. Discussed symptomatic treatment.  Atrovent  nasal spray for nasal congestion. Explained lack of efficacy of antibiotics in viral disease.  Typical duration of symptoms discussed.   Return and ED precautions given and voiced understanding. Discussed MDM, treatment plan and plan for follow-up with patient who agrees with plan.     Final Clinical Impressions(s) / UC Diagnoses   Final diagnoses:  URI with cough and congestion     Discharge Instructions      We will contact you if your strep test is positive.  If negative, you have a viral respiratory infection that will gradually improve over the next 7-10 days. Cough may last up to 3 weeks.    You can take Tylenol  and/or Ibuprofen  as needed for fever reduction and pain relief.    For cough: honey 1/2 to 1 teaspoon (you can dilute the honey in water or another fluid).  You can  also use guaifenesin and dextromethorphan for cough.  You can use a humidifier for chest congestion and cough.  If you don't have a humidifier, you can sit in the  bathroom with the hot shower running.      For sore throat: try warm salt water gargles, Mucinex sore throat cough drops or cepacol lozenges, throat spray, warm tea or water with lemon/honey, popsicles or ice, or OTC cold relief medicine for throat discomfort. You can also purchase chloraseptic spray at the pharmacy or dollar store.    For congestion: take a daily anti-histamine like Zyrtec, Claritin, and a oral decongestant, such as pseudoephedrine. Pick up the prescription nasal spray from the pharmacy.    It is important to stay hydrated: drink plenty of fluids (water, gatorade/powerade/pedialyte, juices, or teas) to keep your throat moisturized and help further relieve irritation/discomfort.    Return or go to the Emergency Department if symptoms worsen or do not improve in the next few days      ED Prescriptions     Medication Sig Dispense Auth. Provider   ipratropium (ATROVENT ) 0.06 % nasal spray Place 2 sprays into both nostrils 4 (four) times daily. 15 mL Daylene Vandenbosch, DO      PDMP not reviewed this encounter.   Kenleigh Toback, DO 01/20/24 1758

## 2024-01-20 NOTE — ED Triage Notes (Signed)
 Patient states 2 days of cough/congestion, headache and bodyaches.  Home flu and covid negative x2.  Taking OTC Cold and Flu, Ibuprofen  with little relief

## 2024-02-17 IMAGING — CR DG SHOULDER 2+V*R*
4 series · 4 of 4 positions shown · non-contrast
Comparison: None.

CLINICAL DATA: Wrestling injury.  AC joint tenderness

EXAM:
RIGHT SHOULDER - 2+ VIEW

[shoulder grashey]
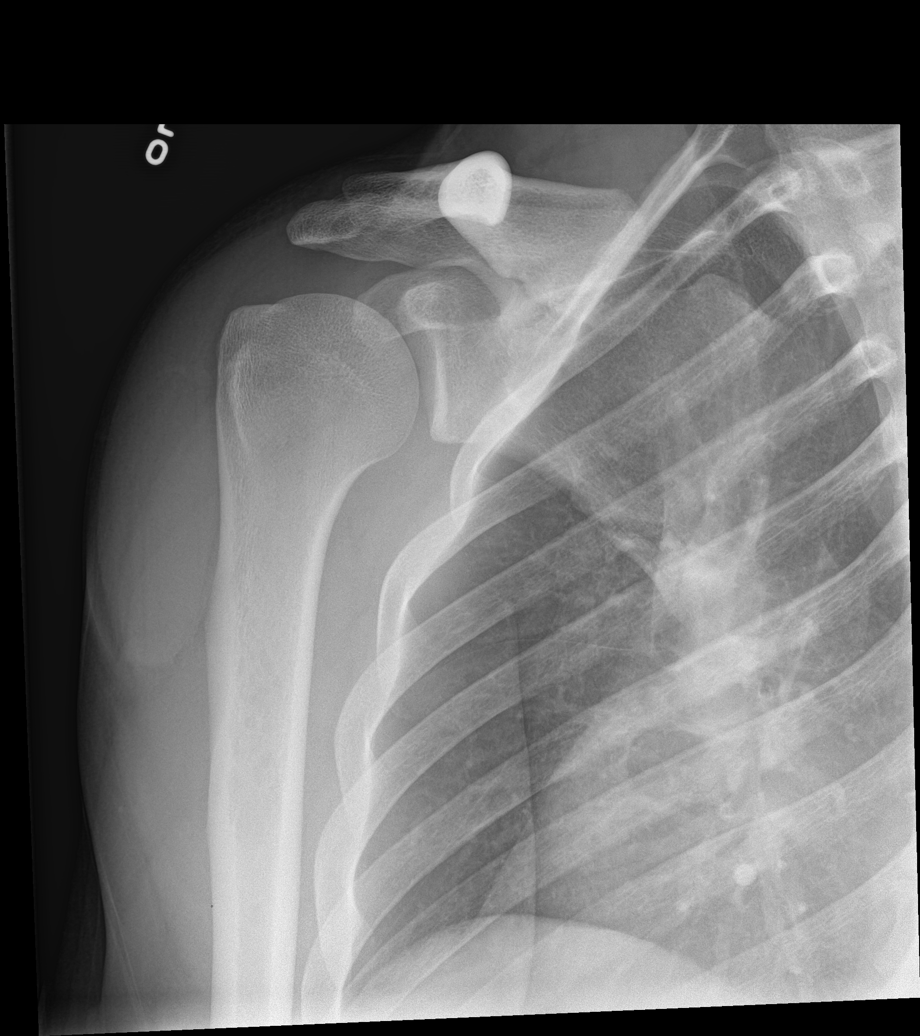

[shoulder y view (1 of 2)]
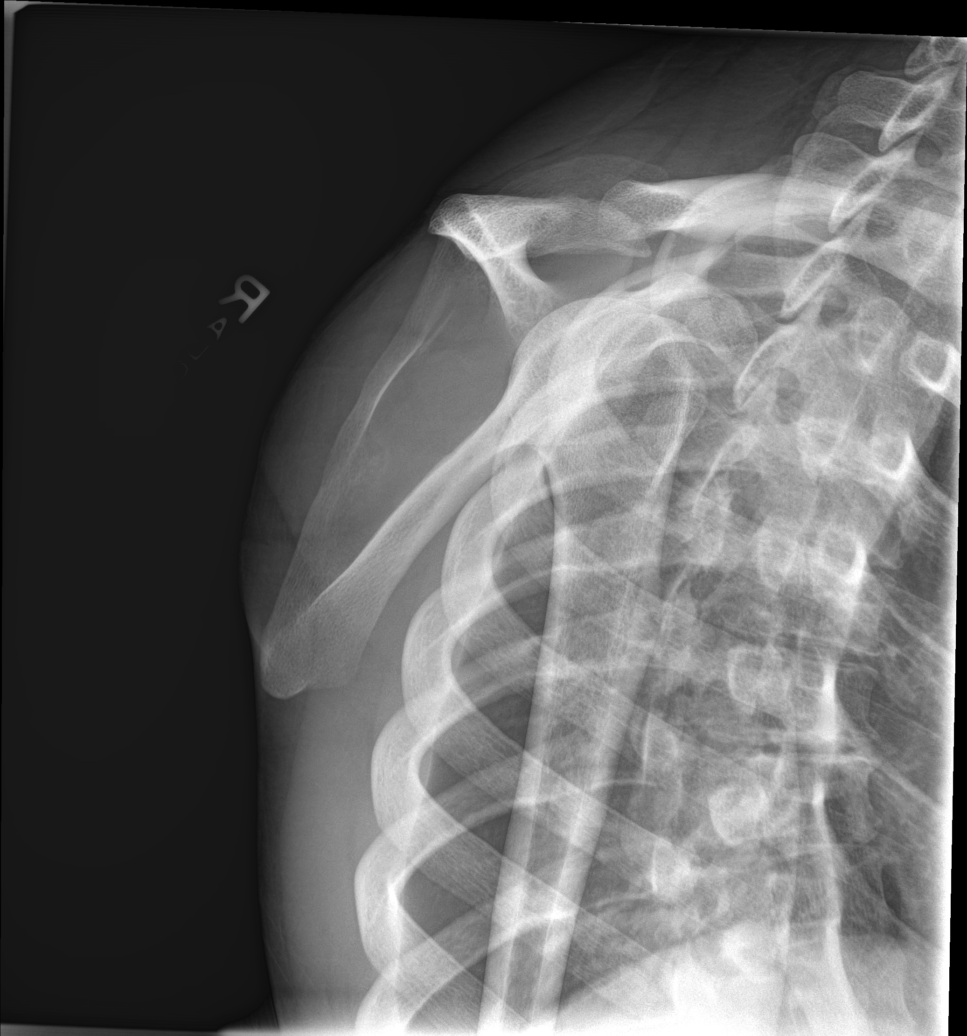

[shoulder axial]
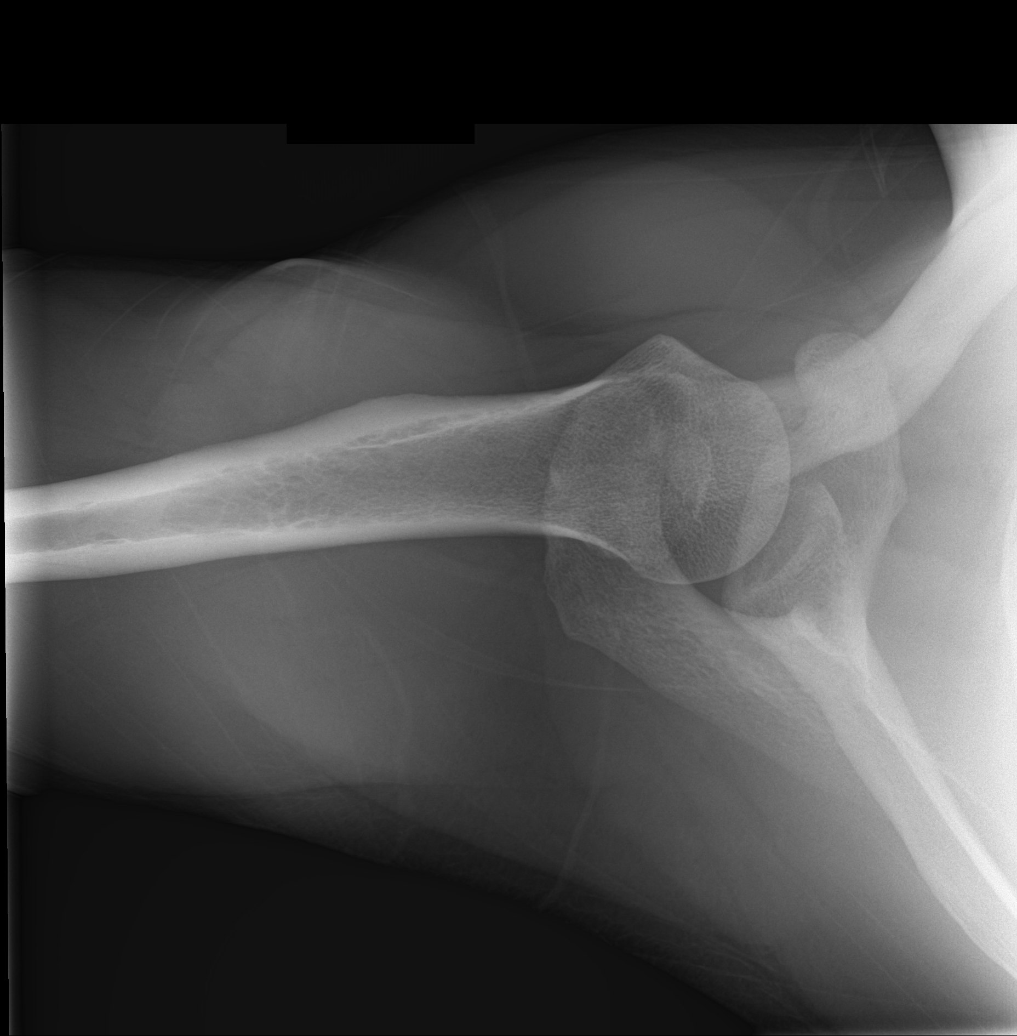

[shoulder y view (2 of 2)]
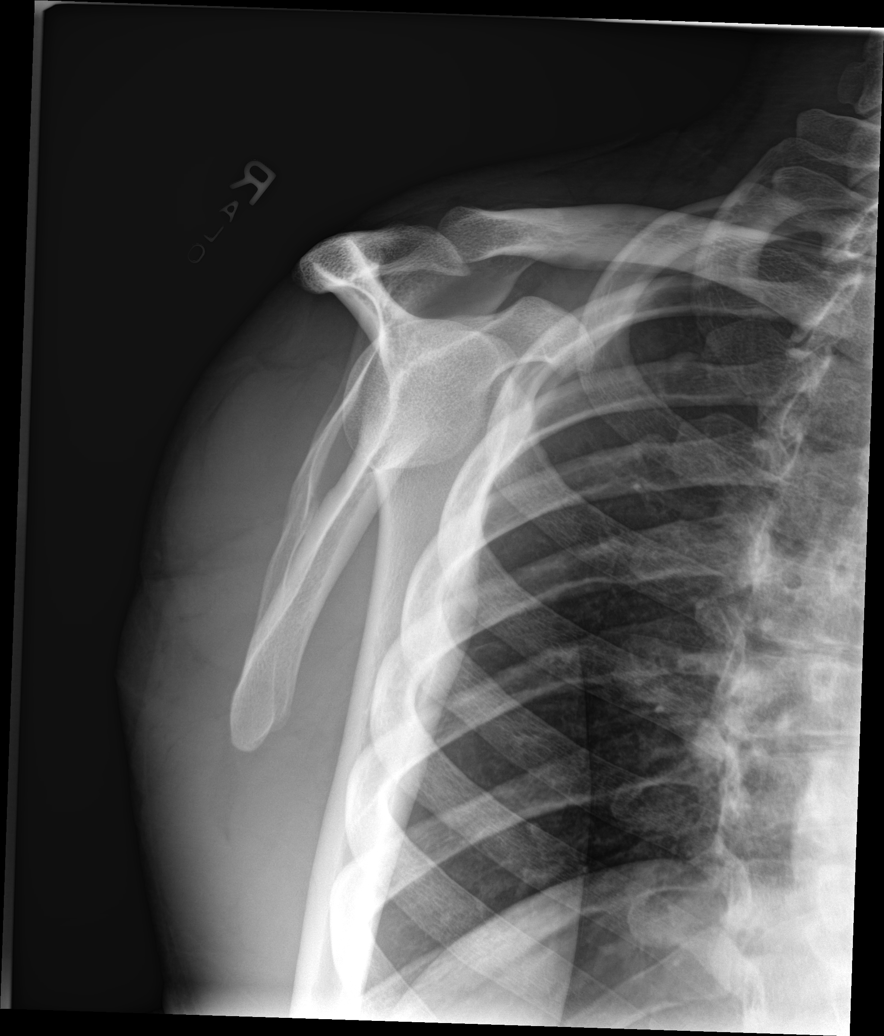

[4 of 4 positions shown; findings below may reference images not displayed]

FINDINGS: Glenohumeral joint is intact. No evidence of scapular fracture or
humeral fracture. The acromioclavicular joint is intact.
IMPRESSION: No fracture or dislocation.

## 2024-03-29 ENCOUNTER — Ambulatory Visit
Admission: EM | Admit: 2024-03-29 | Discharge: 2024-03-29 | Disposition: A | Discharge status: Home / Self Care | Payer: MEDICAID | Attending: Emergency Medicine | Admitting: Emergency Medicine

## 2024-03-29 DIAGNOSIS — K529 Noninfective gastroenteritis and colitis, unspecified: Secondary | ICD-10-CM

## 2024-03-29 DIAGNOSIS — R112 Nausea with vomiting, unspecified: Secondary | ICD-10-CM

## 2024-03-29 MED ORDER — ONDANSETRON HCL 4 MG PO TABS: 4.0000 mg | ORAL_TABLET | Freq: Three times a day (TID) | ORAL | Status: AC | PRN

## 2024-03-29 MED ORDER — ONDANSETRON 8 MG PO TBDP
8.0000 mg | ORAL_TABLET | Freq: Once | ORAL | Status: AC
  Administered 2024-03-29: 8 mg via ORAL

## 2024-03-29 NOTE — Discharge Instructions (Addendum)
 Rest,push fluids(jello, poposicles, pedilyte,gatorade, etc), avoid spicy,greasy,fried foods, avoid acidic foods like tomatoes, red sauce, orange juice, etc.   Follow up with PCP in 3 days, sooner if worse.   If you are unable to keep p.o. fluids down or feel like you are getting worse- go to the emergency room for further evaluation may need labs/ IV fluids at that time.

## 2024-03-29 NOTE — ED Triage Notes (Signed)
 Patient staets that he's had his sx since 5 pm yesterday.  Nausea Vomiting Diarrhea    No other sx.

## 2024-03-29 NOTE — ED Provider Notes (Signed)
 [facility]    CSN: [id number] Arrival date & time: 03/29/24  9185      History   Chief Complaint Chief Complaint  Patient presents with   Nausea   Emesis    HPI Jonathan Cooke is a 23 y.o. male.   Jonathan Cooke, 23 year old male pt, presents to urgent care for evaluation of nausea vomiting, diarrhea,  1 day.  Pt states a coworker was recently sick. Pt is able to take po's. Needs work note, last vomited today/diarrhea yesterday. Pt is non toxic appearing, VSS, not tachycardic.   The history is provided by the patient. No language interpreter was used.    Past Medical History:  Diagnosis Date   Bipolar disorder (HCC)    Tonsil, abscess     Patient Active Problem List   Diagnosis Date Noted   Nausea vomiting and diarrhea 03/29/2024   Gastroenteritis 03/29/2024   Abdominal pain, chronic, right lower quadrant 06/11/2023   Chronic right-sided low back pain without sciatica 06/11/2023    Past Surgical History:  Procedure Laterality Date   NO PAST SURGERIES         Home Medications    Prior to Admission medications   Medication Sig Start Date End Date Taking? Authorizing Provider  divalproex (DEPAKOTE ER) 500 MG 24 hr tablet Take 1,500 mg by mouth at bedtime.   Yes [provider]  haloperidol decanoate (HALDOL DECANOATE) 100 MG/ML injection Inject 100 mg into the muscle every 28 (twenty-eight) days.   Yes [provider]  metFORMIN (GLUCOPHAGE-XR) 500 MG 24 hr tablet Take 500 mg by mouth 2 (two) times daily. 01/11/24  Yes [provider]  ondansetron (ZOFRAN) 4 MG tablet Take 1 tablet (4 mg total) by mouth every 8 (eight) hours as needed for up to 3 days for nausea or vomiting. 03/29/24 04/01/24 Yes Taavi Hoose, NP  ibuprofen  (ADVIL ) 600 MG tablet Take 1 tablet (600 mg total) by mouth every 6 (six) hours as needed. 01/03/24   Mortenson, Ashley, MD  ipratropium (ATROVENT ) 0.06 % nasal spray Place 2 sprays into both nostrils 4  (four) times daily. 01/20/24   Kriste Berth, DO    Family History Family History  Problem Relation Age of Onset   Healthy Mother    Other Father        MVA    Social History Social History   Tobacco Use   Smoking status: Former    Types: Cigarettes   Smokeless tobacco: Current   Tobacco comments:    3 cig/day  Vaping Use   Vaping status: Every Day  Substance Use Topics   Alcohol use: Yes    Comment: rarely   Drug use: Yes    Frequency: 7.0 times per week    Types: Marijuana     Allergies   Penicillin g   Review of Systems Review of Systems  Constitutional:  Negative for fever.  Gastrointestinal:  Positive for diarrhea, nausea and vomiting.  All other systems reviewed and are negative.    Physical Exam Triage Vital Signs ED Triage Vitals  Encounter Vitals Group     BP      Girls Systolic BP Percentile      Girls Diastolic BP Percentile      Boys Systolic BP Percentile      Boys Diastolic BP Percentile      Pulse      Resp      Temp      Temp src  SpO2      Weight      Height      Head Circumference      Peak Flow      Pain Score      Pain Loc      Pain Education      Exclude from Growth Chart    No data found.  Updated Vital Signs BP 126/88 (BP Location: Right Arm)   Pulse 82   Temp 98.4 F (36.9 C) (Oral)   Resp 17   Wt 210 lb (95.3 kg)   SpO2 97%   BMI 34.95 kg/m   Visual Acuity Right Eye Distance:   Left Eye Distance:   Bilateral Distance:    Right Eye Near:   Left Eye Near:    Bilateral Near:     Physical Exam Vitals and nursing note reviewed.  Constitutional:      General: He is not in acute distress.    Appearance: He is well-developed and well-groomed.  HENT:     Head: Normocephalic and atraumatic.  Eyes:     Conjunctiva/sclera: Conjunctivae normal.  Cardiovascular:     Rate and Rhythm: Normal rate and regular rhythm.     Heart sounds: Normal heart sounds. No murmur heard. Pulmonary:     Effort: Pulmonary  effort is normal. No respiratory distress.     Breath sounds: Normal breath sounds and air entry.  Abdominal:     General: Bowel sounds are increased.     Palpations: Abdomen is soft.     Tenderness: There is no abdominal tenderness. There is no guarding or rebound.  Musculoskeletal:        General: No swelling.     Cervical back: Neck supple.  Skin:    General: Skin is warm and dry.     Capillary Refill: Capillary refill takes less than 2 seconds.  Neurological:     General: No focal deficit present.     Mental Status: He is alert and oriented to person, place, and time.     GCS: GCS eye subscore is 4. GCS verbal subscore is 5. GCS motor subscore is 6.  Psychiatric:        Mood and Affect: Mood normal.        Behavior: Behavior is cooperative.      UC Treatments / Results  Labs (all labs ordered are listed, but only abnormal results are displayed) Labs Reviewed - No data to display  EKG   Radiology No results found.  Procedures Procedures (including critical care time)  Medications Ordered in UC Medications  ondansetron (ZOFRAN-ODT) disintegrating tablet 8 mg (8 mg Oral Given 03/29/24 0854)    Initial Impression / Assessment and Plan / UC Course  I have reviewed the triage vital signs and the nursing notes.  Pertinent labs & imaging results that were available during my care of the patient were reviewed by me and considered in my medical decision making (see chart for details).  Clinical Course as of 03/29/24 0900  Wed Mar 29, 2024  9142 Zofran odt given in office. [JD]    Clinical Course User Index [JD] Jaren Kearn, Rilla, NP  Discussed exam findings and plan of care with patient, prescription of Zofran , recommend pushing p.o. fluids , work note given ,strict go to ER precautions given.   Patient verbalized understanding to this provider.  Ddx: Viral gastroenteritis, viral illness food poisoning, dehydration Final Clinical Impressions(s) / UC Diagnoses    Final diagnoses:  Nausea vomiting  and diarrhea  Gastroenteritis     Discharge Instructions      Rest,push fluids(jello, poposicles, pedilyte,gatorade, etc), avoid spicy,greasy,fried foods, avoid acidic foods like tomatoes, red sauce, orange juice, etc.   Follow up with PCP in 3 days, sooner if worse.   If you are unable to keep p.o. fluids down or feel like you are getting worse- go to the emergency room for further evaluation may need labs/ IV fluids at that time.     ED Prescriptions     Medication Sig Dispense Auth. Provider   ondansetron (ZOFRAN) 4 MG tablet Take 1 tablet (4 mg total) by mouth every 8 (eight) hours as needed for up to 3 days for nausea or vomiting. 9 tablet Mung Rinker, NP      PDMP not reviewed this encounter.   Aminta Loose, NP 03/29/24 [phone]

## 2024-05-22 ENCOUNTER — Ambulatory Visit: Payer: Self-pay | Admitting: Emergency Medicine

## 2024-05-22 ENCOUNTER — Ambulatory Visit
Admission: EM | Admit: 2024-05-22 | Discharge: 2024-05-22 | Disposition: A | Discharge status: Home / Self Care | Payer: MEDICAID | Attending: Emergency Medicine | Admitting: Emergency Medicine

## 2024-05-22 DIAGNOSIS — Z20822 Contact with and (suspected) exposure to covid-19: Secondary | ICD-10-CM | POA: Diagnosis not present

## 2024-05-22 DIAGNOSIS — B349 Viral infection, unspecified: Secondary | ICD-10-CM | POA: Diagnosis not present

## 2024-05-22 DIAGNOSIS — U071 COVID-19: Secondary | ICD-10-CM

## 2024-05-22 LAB — POC SOFIA SARS ANTIGEN FIA: SARS Coronavirus 2 Ag: POSITIVE — AB

## 2024-05-22 MED ORDER — IBUPROFEN 600 MG PO TABS: 600.0000 mg | ORAL_TABLET | Freq: Four times a day (QID) | ORAL | 0 refills | Status: AC | PRN

## 2024-05-22 MED ORDER — ONDANSETRON 8 MG PO TBDP: ORAL_TABLET | ORAL | 0 refills | Status: AC

## 2024-05-22 MED ORDER — PROMETHAZINE-DM 6.25-15 MG/5ML PO SYRP: 5.0000 mL | ORAL_SOLUTION | Freq: Four times a day (QID) | ORAL | 0 refills | Status: AC | PRN

## 2024-05-22 MED ORDER — FLUTICASONE PROPIONATE 50 MCG/ACT NA SUSP: 2.0000 | Freq: Every day | NASAL | 0 refills | Status: AC

## 2024-05-22 NOTE — ED Triage Notes (Signed)
 Pt present fever with nausea, symptoms started Saturday, pt took OTC medication but still feels fatigue,loss of appetite, and dehydrated

## 2024-05-22 NOTE — ED Provider Notes (Signed)
 " HPI  SUBJECTIVE:  Jonathan Cooke is a 24 y.o. male who presents with 3 days of body aches, fevers Tmax 101, decreased appetite, headache, nasal congestion, yellowish-brown rhinorrhea, cough productive of brown and yellow sputum.  He had a sore throat and postnasal drip, but this has resolved.  He also reports nausea, states that he is coughing all night long.  No wheezing, shortness of breath, dyspnea on exertion, vomiting, diarrhea, abdominal pain.  He was exposed to COVID last week.  No known flu exposure.  He got the [employer] COVID-vaccine.  He did not get this years flu vaccine.  No antipyretic in the past 6 hours.  He has tried pushing water without improvement in his symptoms.  He has also been taking over-the-counter cold and flu medications and rest with improvement.  His cough is worse when he smokes marijuana/vapes.  He has a past medical history of bipolar.  PCP: [facility].    Past Medical History:  Diagnosis Date   Bipolar disorder (HCC)    Tonsil, abscess     Past Surgical History:  Procedure Laterality Date   NO PAST SURGERIES      Family History  Problem Relation Age of Onset   Healthy Mother    Other Father        MVA    Social History[1]  Current Medications[2]  Allergies[3]   ROS  As noted in HPI.   Physical Exam  BP 132/82 (BP Location: Left Arm)   Pulse (!) 103   Temp 97.8 F (36.6 C) (Oral)   Resp 18   Wt 98.5 kg   SpO2 100%   BMI 36.14 kg/m   Constitutional: Well developed, well nourished, no acute distress Eyes: PERRL, EOMI, conjunctiva normal bilaterally HENT: Normocephalic, atraumatic,mucus membranes moist.  Positive yellowish nasal congestion.  Erythematous, swollen turbinates.  No maxillary, frontal sinus tenderness.  Tonsils slightly enlarged and slightly erythematous, without exudates.  Uvula midline.   Neck: No cervical lymphadenopathy Respiratory: Clear to auscultation bilaterally, no rales, no wheezing, no rhonchi.  Positive  lateral chest wall tenderness Cardiovascular: Normal rate and rhythm, no murmurs, no gallops, no rubs GI: nondistended skin: No rash, skin intact Musculoskeletal: no deformities Neurologic: Alert & oriented x 3, CN III-XII grossly intact, no motor deficits, sensation grossly intact Psychiatric: Speech and behavior appropriate   ED Course   Medications - No data to display  Orders Placed This Encounter  Procedures   POC SARS Coronavirus Ag    Standing Status:   Standing    Number of Occurrences:   1   Results for orders placed or performed during the hospital encounter of 05/22/24 (from the past 24 hours)  POC SARS Coronavirus Ag     Status: Abnormal   Collection Time: 05/22/24  9:03 AM  Result Value Ref Range   SARS Coronavirus 2 Ag Positive (A) Negative   No results found.  ED Clinical Impression  1. COVID-19 virus infection   2. Exposure to confirmed case of COVID-19   3. Viral illness      ED Assessment/Plan    Patient presents with acute illness with systemic symptoms of tachycardia  Will check COVID as he has exposure to known case of COVID.  He does qualify for antivirals due to history of bipolar, but after discussing the risks and benefits of antivirals, he has decided to defer them.  I think this is reasonable.  Unfortunately, he is out of the treatment window for Tamiflu, so deferring  testing for influenza.  Discontinue over-the-counter cold medications.  Home with Flonase , Mucinex D, saline nasal irrigation, Promethazine  DM for cough.  Tylenol  combined with ibuprofen  3 or 4 times a day as needed for pain, headache and fever, push electrolyte containing fluids.  Zofran  for nausea.  Work note.  Will contact patient [phone] if COVID is positive.  He will need to mask for an additional 7 days if COVID is positive.  COVID positive.  Called patient and informed him of positive lab results.  Supportive treatment.  Reemphasized masking.  Patient agrees with  plan.  Discussed labs,  MDM, treatment plan, and plan for follow-up with patient. patient agrees with plan.   Meds ordered this encounter  Medications   fluticasone  (FLONASE ) 50 MCG/ACT nasal spray    Sig: Place 2 sprays into both nostrils daily.    Dispense:  16 g    Refill:  0   ibuprofen  (ADVIL ) 600 MG tablet    Sig: Take 1 tablet (600 mg total) by mouth every 6 (six) hours as needed.    Dispense:  30 tablet    Refill:  0   promethazine -dextromethorphan (PROMETHAZINE -DM) 6.25-15 MG/5ML syrup    Sig: Take 5 mLs by mouth 4 (four) times daily as needed for cough.    Dispense:  118 mL    Refill:  0   ondansetron  (ZOFRAN -ODT) 8 MG disintegrating tablet    Sig: 1/2- 1 tablet q 8 hr prn nausea, vomiting    Dispense:  20 tablet    Refill:  0      *This clinic note was created using Scientist, clinical (histocompatibility and immunogenetics). Therefore, there may be occasional mistakes despite careful proofreading. ?      [1]  Social History Tobacco Use   Smoking status: Former    Types: Cigarettes   Smokeless tobacco: Current   Tobacco comments:    3 cig/day  Vaping Use   Vaping status: Every Day  Substance Use Topics   Alcohol use: Yes    Comment: rarely   Drug use: Yes    Frequency: 7.0 times per week    Types: Marijuana  [2] No current facility-administered medications for this encounter.  Current Outpatient Medications:    fluticasone  (FLONASE ) 50 MCG/ACT nasal spray, Place 2 sprays into both nostrils daily., Disp: 16 g, Rfl: 0   ibuprofen  (ADVIL ) 600 MG tablet, Take 1 tablet (600 mg total) by mouth every 6 (six) hours as needed., Disp: 30 tablet, Rfl: 0   ondansetron  (ZOFRAN -ODT) 8 MG disintegrating tablet, 1/2- 1 tablet q 8 hr prn nausea, vomiting, Disp: 20 tablet, Rfl: 0   promethazine -dextromethorphan (PROMETHAZINE -DM) 6.25-15 MG/5ML syrup, Take 5 mLs by mouth 4 (four) times daily as needed for cough., Disp: 118 mL, Rfl: 0   divalproex (DEPAKOTE ER) 500 MG 24 hr tablet, Take 1,500 mg by mouth  at bedtime., Disp: , Rfl:    haloperidol decanoate (HALDOL DECANOATE) 100 MG/ML injection, Inject 100 mg into the muscle every 28 (twenty-eight) days., Disp: , Rfl:    metFORMIN (GLUCOPHAGE-XR) 500 MG 24 hr tablet, Take 500 mg by mouth 2 (two) times daily., Disp: , Rfl:  [3]  Allergies Allergen Reactions   Penicillin G Rash    Full body rash Full body rash      Van Knee, MD 05/22/24 1206  "

## 2024-05-22 NOTE — Discharge Instructions (Signed)
 Discontinue over-the-counter cold medications.  Flonase , Mucinex D, saline nasal irrigation with a NeilMed sinus rinse and distilled water as often as you want to prevent a bacterial sinus infection, Promethazine  DM for cough.  1000 mg of Tylenol  combined with 600 mg of ibuprofen  3 or 4 times a day as needed for pain, headache and fever, push electrolyte containing fluids such as Pedialyte, liquid IV, Gatorade.  Zofran  for nausea. You will need to mask for an additional 7 days if COVID is positive.
